# Patient Record
Sex: Female | Born: 1935 | Race: White | Hispanic: No | Marital: Married | Smoking: Former smoker
Health system: Southern US, Community
[De-identification: ages and names within clinical notes are randomized; demographics above are authoritative.]

## PROBLEM LIST (undated history)

## (undated) DIAGNOSIS — I10 Essential (primary) hypertension: Secondary | ICD-10-CM

## (undated) DIAGNOSIS — C801 Malignant (primary) neoplasm, unspecified: Secondary | ICD-10-CM

## (undated) DIAGNOSIS — G459 Transient cerebral ischemic attack, unspecified: Secondary | ICD-10-CM

## (undated) DIAGNOSIS — J449 Chronic obstructive pulmonary disease, unspecified: Secondary | ICD-10-CM

## (undated) DIAGNOSIS — I219 Acute myocardial infarction, unspecified: Secondary | ICD-10-CM

## (undated) HISTORY — DX: Transient cerebral ischemic attack, unspecified: G45.9

## (undated) HISTORY — DX: Malignant (primary) neoplasm, unspecified: C80.1

## (undated) HISTORY — DX: Essential (primary) hypertension: I10

## (undated) HISTORY — PX: OTHER SURGICAL HISTORY: SHX169

## (undated) HISTORY — DX: Acute myocardial infarction, unspecified: I21.9

---

## 1998-09-24 ENCOUNTER — Encounter: Payer: Self-pay | Admitting: Emergency Medicine

## 1998-09-24 ENCOUNTER — Emergency Department (HOSPITAL_COMMUNITY): Admission: EM | Admit: 1998-09-24 | Discharge: 1998-09-24 | Payer: Self-pay | Admitting: Emergency Medicine

## 1998-10-21 ENCOUNTER — Ambulatory Visit (HOSPITAL_COMMUNITY): Admission: RE | Admit: 1998-10-21 | Discharge: 1998-10-21 | Payer: Self-pay | Admitting: Family Medicine

## 1998-10-21 ENCOUNTER — Encounter: Payer: Self-pay | Admitting: Family Medicine

## 2003-08-29 ENCOUNTER — Ambulatory Visit (HOSPITAL_COMMUNITY): Admission: RE | Admit: 2003-08-29 | Discharge: 2003-08-29 | Payer: Self-pay | Admitting: Ophthalmology

## 2004-10-21 ENCOUNTER — Emergency Department (HOSPITAL_COMMUNITY): Admission: EM | Admit: 2004-10-21 | Discharge: 2004-10-21 | Payer: Self-pay | Admitting: Emergency Medicine

## 2004-11-01 ENCOUNTER — Inpatient Hospital Stay (HOSPITAL_COMMUNITY): Admission: EM | Admit: 2004-11-01 | Discharge: 2004-11-16 | Payer: Self-pay | Admitting: Emergency Medicine

## 2004-11-01 ENCOUNTER — Ambulatory Visit: Payer: Self-pay | Admitting: Family Medicine

## 2004-11-02 ENCOUNTER — Encounter (INDEPENDENT_AMBULATORY_CARE_PROVIDER_SITE_OTHER): Payer: Self-pay | Admitting: Cardiology

## 2005-06-07 ENCOUNTER — Ambulatory Visit (HOSPITAL_COMMUNITY): Admission: RE | Admit: 2005-06-07 | Discharge: 2005-06-07 | Payer: Self-pay | Admitting: Gastroenterology

## 2005-06-22 ENCOUNTER — Encounter: Admission: RE | Admit: 2005-06-22 | Discharge: 2005-06-22 | Payer: Self-pay | Admitting: Family Medicine

## 2006-01-31 ENCOUNTER — Inpatient Hospital Stay (HOSPITAL_COMMUNITY): Admission: EM | Admit: 2006-01-31 | Discharge: 2006-02-02 | Payer: Self-pay | Admitting: Emergency Medicine

## 2006-07-28 ENCOUNTER — Encounter: Admission: RE | Admit: 2006-07-28 | Discharge: 2006-07-28 | Payer: Self-pay | Admitting: Family Medicine

## 2006-09-20 ENCOUNTER — Ambulatory Visit: Admission: RE | Admit: 2006-09-20 | Discharge: 2006-09-20 | Payer: Self-pay | Admitting: Gynecologic Oncology

## 2006-09-22 ENCOUNTER — Encounter: Admission: RE | Admit: 2006-09-22 | Discharge: 2006-09-22 | Payer: Self-pay | Admitting: Gynecologic Oncology

## 2006-12-25 ENCOUNTER — Ambulatory Visit (HOSPITAL_COMMUNITY): Admission: RE | Admit: 2006-12-25 | Discharge: 2006-12-25 | Payer: Self-pay | Admitting: Gynecologic Oncology

## 2007-01-09 ENCOUNTER — Ambulatory Visit: Admission: RE | Admit: 2007-01-09 | Discharge: 2007-01-09 | Payer: Self-pay | Admitting: Gynecologic Oncology

## 2007-08-27 ENCOUNTER — Ambulatory Visit: Payer: Self-pay | Admitting: Cardiovascular Disease

## 2007-08-27 LAB — CONVERTED CEMR LAB
Chloride: 94 meq/L — ABNORMAL LOW (ref 96–112)
GFR calc Af Amer: 104 mL/min
GFR calc non Af Amer: 86 mL/min
Glucose, Bld: 107 mg/dL — ABNORMAL HIGH (ref 70–99)
Sodium: 129 meq/L — ABNORMAL LOW (ref 135–145)

## 2007-09-07 ENCOUNTER — Ambulatory Visit: Payer: Self-pay

## 2007-09-07 ENCOUNTER — Encounter: Payer: Self-pay | Admitting: Cardiovascular Disease

## 2007-09-19 DIAGNOSIS — F3289 Other specified depressive episodes: Secondary | ICD-10-CM | POA: Insufficient documentation

## 2007-09-19 DIAGNOSIS — K219 Gastro-esophageal reflux disease without esophagitis: Secondary | ICD-10-CM

## 2007-09-19 DIAGNOSIS — F329 Major depressive disorder, single episode, unspecified: Secondary | ICD-10-CM

## 2007-10-01 ENCOUNTER — Ambulatory Visit: Payer: Self-pay | Admitting: Pulmonary Disease

## 2007-10-01 ENCOUNTER — Ambulatory Visit: Payer: Self-pay | Admitting: Internal Medicine

## 2007-10-01 DIAGNOSIS — R0989 Other specified symptoms and signs involving the circulatory and respiratory systems: Secondary | ICD-10-CM | POA: Insufficient documentation

## 2007-10-01 DIAGNOSIS — I219 Acute myocardial infarction, unspecified: Secondary | ICD-10-CM | POA: Insufficient documentation

## 2007-10-01 DIAGNOSIS — I10 Essential (primary) hypertension: Secondary | ICD-10-CM | POA: Insufficient documentation

## 2007-10-01 DIAGNOSIS — J309 Allergic rhinitis, unspecified: Secondary | ICD-10-CM | POA: Insufficient documentation

## 2007-10-01 DIAGNOSIS — J439 Emphysema, unspecified: Secondary | ICD-10-CM

## 2007-10-01 DIAGNOSIS — I6789 Other cerebrovascular disease: Secondary | ICD-10-CM

## 2007-10-01 DIAGNOSIS — Z85828 Personal history of other malignant neoplasm of skin: Secondary | ICD-10-CM

## 2007-10-01 DIAGNOSIS — R0609 Other forms of dyspnea: Secondary | ICD-10-CM

## 2007-10-05 ENCOUNTER — Ambulatory Visit: Payer: Self-pay | Admitting: Cardiovascular Disease

## 2008-01-02 ENCOUNTER — Encounter: Payer: Self-pay | Admitting: Pulmonary Disease

## 2008-05-22 ENCOUNTER — Ambulatory Visit: Payer: Self-pay | Admitting: Cardiovascular Disease

## 2008-06-20 ENCOUNTER — Ambulatory Visit: Payer: Self-pay | Admitting: Pulmonary Disease

## 2008-09-23 ENCOUNTER — Ambulatory Visit: Payer: Self-pay

## 2008-12-24 ENCOUNTER — Ambulatory Visit: Payer: Self-pay | Admitting: Pulmonary Disease

## 2008-12-24 DIAGNOSIS — J9610 Chronic respiratory failure, unspecified whether with hypoxia or hypercapnia: Secondary | ICD-10-CM

## 2009-02-20 ENCOUNTER — Encounter (INDEPENDENT_AMBULATORY_CARE_PROVIDER_SITE_OTHER): Payer: Self-pay | Admitting: *Deleted

## 2009-11-09 ENCOUNTER — Telehealth: Payer: Self-pay | Admitting: Pulmonary Disease

## 2010-01-21 ENCOUNTER — Ambulatory Visit: Payer: Self-pay | Admitting: Pulmonary Disease

## 2010-09-21 ENCOUNTER — Encounter: Payer: Self-pay | Admitting: Cardiovascular Disease

## 2010-09-21 DIAGNOSIS — I6529 Occlusion and stenosis of unspecified carotid artery: Secondary | ICD-10-CM | POA: Insufficient documentation

## 2010-09-24 ENCOUNTER — Ambulatory Visit: Admission: RE | Admit: 2010-09-24 | Discharge: 2010-09-24 | Payer: Self-pay | Source: Home / Self Care

## 2010-09-24 ENCOUNTER — Encounter: Payer: Self-pay | Admitting: Cardiovascular Disease

## 2010-09-24 ENCOUNTER — Ambulatory Visit
Admission: RE | Admit: 2010-09-24 | Discharge: 2010-09-24 | Payer: Medicare Other | Source: Home / Self Care | Attending: Pulmonary Disease | Admitting: Pulmonary Disease

## 2010-09-24 ENCOUNTER — Inpatient Hospital Stay (HOSPITAL_COMMUNITY)
Admission: EM | Admit: 2010-09-24 | Discharge: 2010-10-06 | Payer: Self-pay | Source: Home / Self Care | Attending: Internal Medicine | Admitting: Internal Medicine

## 2010-09-25 DIAGNOSIS — J449 Chronic obstructive pulmonary disease, unspecified: Secondary | ICD-10-CM

## 2010-09-25 DIAGNOSIS — Z01818 Encounter for other preprocedural examination: Secondary | ICD-10-CM

## 2010-09-29 ENCOUNTER — Encounter (INDEPENDENT_AMBULATORY_CARE_PROVIDER_SITE_OTHER): Payer: Self-pay | Admitting: Internal Medicine

## 2010-09-30 ENCOUNTER — Telehealth: Payer: Self-pay | Admitting: Cardiovascular Disease

## 2010-10-04 LAB — BASIC METABOLIC PANEL
BUN: 10 mg/dL (ref 6–23)
BUN: 8 mg/dL (ref 6–23)
BUN: 7 mg/dL (ref 6–23)
BUN: 7 mg/dL (ref 6–23)
BUN: 13 mg/dL (ref 6–23)
BUN: 19 mg/dL (ref 6–23)
BUN: 20 mg/dL (ref 6–23)
BUN: 20 mg/dL (ref 6–23)
CO2: 29 mEq/L (ref 19–32)
CO2: 29 mEq/L (ref 19–32)
CO2: 24 mEq/L (ref 19–32)
CO2: 24 mEq/L (ref 19–32)
CO2: 34 mEq/L — ABNORMAL HIGH (ref 19–32)
CO2: 33 mEq/L — ABNORMAL HIGH (ref 19–32)
CO2: 33 mEq/L — ABNORMAL HIGH (ref 19–32)
CO2: 31 mEq/L (ref 19–32)
Calcium: 9.3 mg/dL (ref 8.4–10.5)
Calcium: 8.8 mg/dL (ref 8.4–10.5)
Calcium: 8 mg/dL — ABNORMAL LOW (ref 8.4–10.5)
Calcium: 7.8 mg/dL — ABNORMAL LOW (ref 8.4–10.5)
Calcium: 8.8 mg/dL (ref 8.4–10.5)
Calcium: 9 mg/dL (ref 8.4–10.5)
Calcium: 8.9 mg/dL (ref 8.4–10.5)
Calcium: 8.7 mg/dL (ref 8.4–10.5)
Chloride: 104 mEq/L (ref 96–112)
Chloride: 103 mEq/L (ref 96–112)
Chloride: 106 mEq/L (ref 96–112)
Chloride: 106 mEq/L (ref 96–112)
Chloride: 93 mEq/L — ABNORMAL LOW (ref 96–112)
Chloride: 93 mEq/L — ABNORMAL LOW (ref 96–112)
Chloride: 97 mEq/L (ref 96–112)
Chloride: 99 mEq/L (ref 96–112)
Creatinine, Ser: 0.76 mg/dL (ref 0.4–1.2)
Creatinine, Ser: 0.74 mg/dL (ref 0.4–1.2)
Creatinine, Ser: 0.78 mg/dL (ref 0.4–1.2)
Creatinine, Ser: 0.72 mg/dL (ref 0.4–1.2)
Creatinine, Ser: 0.68 mg/dL (ref 0.4–1.2)
Creatinine, Ser: 0.72 mg/dL (ref 0.4–1.2)
Creatinine, Ser: 0.81 mg/dL (ref 0.4–1.2)
Creatinine, Ser: 0.7 mg/dL (ref 0.4–1.2)
GFR calc Af Amer: >60 mL/min (ref >60)
GFR calc Af Amer: >60 mL/min (ref >60)
GFR calc Af Amer: >60 mL/min (ref >60)
GFR calc Af Amer: >60 mL/min (ref >60)
GFR calc Af Amer: >60 mL/min (ref >60)
GFR calc Af Amer: >60 mL/min (ref >60)
GFR calc Af Amer: >60 mL/min (ref >60)
GFR calc Af Amer: >60 mL/min (ref >60)
GFR calc non Af Amer: >60 mL/min (ref >60)
GFR calc non Af Amer: >60 mL/min (ref >60)
GFR calc non Af Amer: >60 mL/min (ref >60)
GFR calc non Af Amer: >60 mL/min (ref >60)
GFR calc non Af Amer: >60 mL/min (ref >60)
GFR calc non Af Amer: >60 mL/min (ref >60)
GFR calc non Af Amer: >60 mL/min (ref >60)
GFR calc non Af Amer: >60 mL/min (ref >60)
Glucose, Bld: 87 mg/dL (ref 70–99)
Glucose, Bld: 102 mg/dL — ABNORMAL HIGH (ref 70–99)
Glucose, Bld: 94 mg/dL (ref 70–99)
Glucose, Bld: 116 mg/dL — ABNORMAL HIGH (ref 70–99)
Glucose, Bld: 138 mg/dL — ABNORMAL HIGH (ref 70–99)
Glucose, Bld: 115 mg/dL — ABNORMAL HIGH (ref 70–99)
Glucose, Bld: 92 mg/dL (ref 70–99)
Glucose, Bld: 97 mg/dL (ref 70–99)
Potassium: 3.9 mEq/L (ref 3.5–5.1)
Potassium: 4.1 mEq/L (ref 3.5–5.1)
Potassium: 4.1 mEq/L (ref 3.5–5.1)
Potassium: 4.3 mEq/L (ref 3.5–5.1)
Potassium: 3.3 mEq/L — ABNORMAL LOW (ref 3.5–5.1)
Potassium: 3.2 mEq/L — ABNORMAL LOW (ref 3.5–5.1)
Potassium: 3.4 mEq/L — ABNORMAL LOW (ref 3.5–5.1)
Potassium: 4 mEq/L (ref 3.5–5.1)
Sodium: 141 mEq/L (ref 135–145)
Sodium: 138 mEq/L (ref 135–145)
Sodium: 137 mEq/L (ref 135–145)
Sodium: 137 mEq/L (ref 135–145)
Sodium: 137 mEq/L (ref 135–145)
Sodium: 136 mEq/L (ref 135–145)
Sodium: 138 mEq/L (ref 135–145)
Sodium: 136 mEq/L (ref 135–145)

## 2010-10-04 LAB — CROSSMATCH
ABO/RH(D): A POS
Antibody Screen: NEGATIVE
Unit division: 0
Unit division: 0

## 2010-10-04 LAB — COMPREHENSIVE METABOLIC PANEL
ALT: 60 U/L — ABNORMAL HIGH (ref 0–35)
ALT: 42 U/L — ABNORMAL HIGH (ref 0–35)
AST: 90 U/L — ABNORMAL HIGH (ref 0–37)
AST: 44 U/L — ABNORMAL HIGH (ref 0–37)
Albumin: 2.2 g/dL — ABNORMAL LOW (ref 3.5–5.2)
Albumin: 2.2 g/dL — ABNORMAL LOW (ref 3.5–5.2)
Alkaline Phosphatase: 98 U/L (ref 39–117)
Alkaline Phosphatase: 93 U/L (ref 39–117)
BUN: 7 mg/dL (ref 6–23)
BUN: 8 mg/dL (ref 6–23)
CO2: 24 mEq/L (ref 19–32)
CO2: 28 mEq/L (ref 19–32)
Calcium: 8.1 mg/dL — ABNORMAL LOW (ref 8.4–10.5)
Calcium: 8.2 mg/dL — ABNORMAL LOW (ref 8.4–10.5)
Chloride: 107 mEq/L (ref 96–112)
Chloride: 100 mEq/L (ref 96–112)
Creatinine, Ser: 0.62 mg/dL (ref 0.4–1.2)
Creatinine, Ser: 0.59 mg/dL (ref 0.4–1.2)
GFR calc Af Amer: >60 mL/min (ref >60)
GFR calc Af Amer: >60 mL/min (ref >60)
GFR calc non Af Amer: >60 mL/min (ref >60)
GFR calc non Af Amer: >60 mL/min (ref >60)
Glucose, Bld: 123 mg/dL — ABNORMAL HIGH (ref 70–99)
Glucose, Bld: 108 mg/dL — ABNORMAL HIGH (ref 70–99)
Potassium: 4.5 mEq/L (ref 3.5–5.1)
Potassium: 4 mEq/L (ref 3.5–5.1)
Sodium: 137 mEq/L (ref 135–145)
Sodium: 134 mEq/L — ABNORMAL LOW (ref 135–145)
Total Bilirubin: 0.9 mg/dL (ref 0.3–1.2)
Total Bilirubin: 1.1 mg/dL (ref 0.3–1.2)
Total Protein: 5 g/dL — ABNORMAL LOW (ref 6.0–8.3)
Total Protein: 5.4 g/dL — ABNORMAL LOW (ref 6.0–8.3)

## 2010-10-04 LAB — PROTIME-INR
INR: 1.13 (ref 0.00–1.49)
INR: 1.17 (ref 0.00–1.49)
INR: 1.02 (ref 0.00–1.49)
INR: 1.07 (ref 0.00–1.49)
INR: 1.07 (ref 0.00–1.49)
Prothrombin Time: 14.7 seconds (ref 11.6–15.2)
Prothrombin Time: 15.1 seconds (ref 11.6–15.2)
Prothrombin Time: 13.6 seconds (ref 11.6–15.2)
Prothrombin Time: 14.1 seconds (ref 11.6–15.2)
Prothrombin Time: 14.1 seconds (ref 11.6–15.2)

## 2010-10-04 LAB — CARDIAC PANEL(CRET KIN+CKTOT+MB+TROPI)
CK, MB: 0.7 ng/mL (ref 0.3–4.0)
CK, MB: 2.8 ng/mL (ref 0.3–4.0)
CK, MB: 3.2 ng/mL (ref 0.3–4.0)
CK, MB: 3.5 ng/mL (ref 0.3–4.0)
Relative Index: INVALID (ref 0.0–2.5)
Relative Index: 2.5 (ref 0.0–2.5)
Relative Index: 2.7 — ABNORMAL HIGH (ref 0.0–2.5)
Relative Index: 1.9 (ref 0.0–2.5)
Total CK: 30 U/L (ref 7–177)
Total CK: 113 U/L (ref 7–177)
Total CK: 118 U/L (ref 7–177)
Total CK: 180 U/L — ABNORMAL HIGH (ref 7–177)
Troponin I: 0.05 ng/mL (ref 0.00–0.06)
Troponin I: 0.02 ng/mL (ref 0.00–0.06)
Troponin I: 0.01 ng/mL (ref 0.00–0.06)
Troponin I: 0.11 ng/mL — ABNORMAL HIGH (ref 0.00–0.06)

## 2010-10-04 LAB — CBC
HCT: 41.4 % (ref 36.0–46.0)
HCT: 40.5 % (ref 36.0–46.0)
HCT: 33.1 % — ABNORMAL LOW (ref 36.0–46.0)
HCT: 28 % — ABNORMAL LOW (ref 36.0–46.0)
HCT: 29.9 % — ABNORMAL LOW (ref 36.0–46.0)
HCT: 24.3 % — ABNORMAL LOW (ref 36.0–46.0)
HCT: 31.3 % — ABNORMAL LOW (ref 36.0–46.0)
HCT: 32.1 % — ABNORMAL LOW (ref 36.0–46.0)
HCT: 30.9 % — ABNORMAL LOW (ref 36.0–46.0)
Hemoglobin: 14.2 g/dL (ref 12.0–15.0)
Hemoglobin: 13.4 g/dL (ref 12.0–15.0)
Hemoglobin: 11 g/dL — ABNORMAL LOW (ref 12.0–15.0)
Hemoglobin: 9.3 g/dL — ABNORMAL LOW (ref 12.0–15.0)
Hemoglobin: 9.1 g/dL — ABNORMAL LOW (ref 12.0–15.0)
Hemoglobin: 8.2 g/dL — ABNORMAL LOW (ref 12.0–15.0)
Hemoglobin: 10.4 g/dL — ABNORMAL LOW (ref 12.0–15.0)
Hemoglobin: 10.8 g/dL — ABNORMAL LOW (ref 12.0–15.0)
Hemoglobin: 10.4 g/dL — ABNORMAL LOW (ref 12.0–15.0)
MCH: 31.7 pg (ref 26.0–34.0)
MCH: 30.8 pg (ref 26.0–34.0)
MCH: 30.6 pg (ref 26.0–34.0)
MCH: 31.2 pg (ref 26.0–34.0)
MCH: 29.2 pg (ref 26.0–34.0)
MCH: 31.4 pg (ref 26.0–34.0)
MCH: 30.2 pg (ref 26.0–34.0)
MCH: 30.8 pg (ref 26.0–34.0)
MCH: 30.8 pg (ref 26.0–34.0)
MCHC: 34.3 g/dL (ref 30.0–36.0)
MCHC: 33.1 g/dL (ref 30.0–36.0)
MCHC: 32.9 g/dL (ref 30.0–36.0)
MCHC: 33.2 g/dL (ref 30.0–36.0)
MCHC: 30.4 g/dL (ref 30.0–36.0)
MCHC: 33.7 g/dL (ref 30.0–36.0)
MCHC: 33.2 g/dL (ref 30.0–36.0)
MCHC: 33.6 g/dL (ref 30.0–36.0)
MCHC: 33.7 g/dL (ref 30.0–36.0)
MCV: 92.4 fL (ref 78.0–100.0)
MCV: 93.1 fL (ref 78.0–100.0)
MCV: 93 fL (ref 78.0–100.0)
MCV: 94 fL (ref 78.0–100.0)
MCV: 95.8 fL (ref 78.0–100.0)
MCV: 93.1 fL (ref 78.0–100.0)
MCV: 91 fL (ref 78.0–100.0)
MCV: 91.5 fL (ref 78.0–100.0)
MCV: 91.4 fL (ref 78.0–100.0)
Platelets: 237 10*3/uL (ref 150–400)
Platelets: 221 10*3/uL (ref 150–400)
Platelets: 178 10*3/uL (ref 150–400)
Platelets: 196 10*3/uL (ref 150–400)
Platelets: 185 10*3/uL (ref 150–400)
Platelets: 231 10*3/uL (ref 150–400)
Platelets: 248 10*3/uL (ref 150–400)
Platelets: 239 10*3/uL (ref 150–400)
Platelets: 284 10*3/uL (ref 150–400)
RBC: 4.48 MIL/uL (ref 3.87–5.11)
RBC: 4.35 MIL/uL (ref 3.87–5.11)
RBC: 3.56 MIL/uL — ABNORMAL LOW (ref 3.87–5.11)
RBC: 2.98 MIL/uL — ABNORMAL LOW (ref 3.87–5.11)
RBC: 3.12 MIL/uL — ABNORMAL LOW (ref 3.87–5.11)
RBC: 2.61 MIL/uL — ABNORMAL LOW (ref 3.87–5.11)
RBC: 3.44 MIL/uL — ABNORMAL LOW (ref 3.87–5.11)
RBC: 3.51 MIL/uL — ABNORMAL LOW (ref 3.87–5.11)
RBC: 3.38 MIL/uL — ABNORMAL LOW (ref 3.87–5.11)
RDW: 14.6 % (ref 11.5–15.5)
RDW: 14.8 % (ref 11.5–15.5)
RDW: 14.8 % (ref 11.5–15.5)
RDW: 15.4 % (ref 11.5–15.5)
RDW: 15.7 % — ABNORMAL HIGH (ref 11.5–15.5)
RDW: 15.5 % (ref 11.5–15.5)
RDW: 15.5 % (ref 11.5–15.5)
RDW: 15.5 % (ref 11.5–15.5)
RDW: 15.1 % (ref 11.5–15.5)
WBC: 9.8 10*3/uL (ref 4.0–10.5)
WBC: 9.1 10*3/uL (ref 4.0–10.5)
WBC: 10.2 10*3/uL (ref 4.0–10.5)
WBC: 10.1 10*3/uL (ref 4.0–10.5)
WBC: 8.6 10*3/uL (ref 4.0–10.5)
WBC: 10.3 10*3/uL (ref 4.0–10.5)
WBC: 10.3 10*3/uL (ref 4.0–10.5)
WBC: 9.5 10*3/uL (ref 4.0–10.5)
WBC: 11.6 10*3/uL — ABNORMAL HIGH (ref 4.0–10.5)

## 2010-10-04 LAB — BLOOD GAS, ARTERIAL
Acid-Base Excess: 4.4 mmol/L — ABNORMAL HIGH (ref 0.0–2.0)
Bicarbonate: 28.3 mEq/L — ABNORMAL HIGH (ref 20.0–24.0)
Drawn by: 317871
FIO2: 0.5 %
O2 Saturation: 87 %
Patient temperature: 98.6
TCO2: 26.7 mmol/L (ref 0–100)
pCO2 arterial: 41 mmHg (ref 35.0–45.0)
pH, Arterial: 7.453 — ABNORMAL HIGH (ref 7.350–7.400)
pO2, Arterial: 48.5 mmHg — ABNORMAL LOW (ref 80.0–100.0)

## 2010-10-04 LAB — VANCOMYCIN, TROUGH: Vancomycin Tr: 10.3 ug/mL (ref 10.0–20.0)

## 2010-10-04 LAB — DIFFERENTIAL
Basophils Absolute: 0 10*3/uL (ref 0.0–0.1)
Basophils Relative: 0 % (ref 0–1)
Eosinophils Absolute: 0.1 10*3/uL (ref 0.0–0.7)
Eosinophils Relative: 1 % (ref 0–5)
Lymphocytes Relative: 21 % (ref 12–46)
Lymphs Abs: 2 10*3/uL (ref 0.7–4.0)
Monocytes Absolute: 0.6 10*3/uL (ref 0.1–1.0)
Monocytes Relative: 7 % (ref 3–12)
Neutro Abs: 7 10*3/uL (ref 1.7–7.7)
Neutrophils Relative %: 72 % (ref 43–77)

## 2010-10-04 LAB — MRSA PCR SCREENING: MRSA by PCR: NEGATIVE

## 2010-10-04 LAB — URINALYSIS, ROUTINE W REFLEX MICROSCOPIC
Bilirubin Urine: NEGATIVE
Hgb urine dipstick: NEGATIVE
Ketones, ur: NEGATIVE mg/dL
Nitrite: NEGATIVE
Protein, ur: NEGATIVE mg/dL
Specific Gravity, Urine: 1.01 (ref 1.005–1.030)
Urine Glucose, Fasting: NEGATIVE mg/dL
Urobilinogen, UA: 0.2 mg/dL (ref 0.0–1.0)
pH: 7 (ref 5.0–8.0)

## 2010-10-04 LAB — HEPARIN LEVEL (UNFRACTIONATED)
Heparin Unfractionated: 0.21 IU/mL — ABNORMAL LOW (ref 0.30–0.70)
Heparin Unfractionated: 0.33 IU/mL (ref 0.30–0.70)
Heparin Unfractionated: 0.36 IU/mL (ref 0.30–0.70)

## 2010-10-04 LAB — ABO/RH: ABO/RH(D): A POS

## 2010-10-04 LAB — GLUCOSE, CAPILLARY: Glucose-Capillary: 110 mg/dL — ABNORMAL HIGH (ref 70–99)

## 2010-10-04 LAB — D-DIMER, QUANTITATIVE: D-Dimer, Quant: 4.65 ug/mL-FEU — ABNORMAL HIGH (ref 0.00–0.48)

## 2010-10-04 LAB — MAGNESIUM: Magnesium: 2.3 mg/dL (ref 1.5–2.5)

## 2010-10-04 LAB — BRAIN NATRIURETIC PEPTIDE
Pro B Natriuretic peptide (BNP): 141 pg/mL — ABNORMAL HIGH (ref 0.0–100.0)
Pro B Natriuretic peptide (BNP): 92.3 pg/mL (ref 0.0–100.0)

## 2010-10-06 LAB — CBC
HCT: 32.9 % — ABNORMAL LOW (ref 36.0–46.0)
Hemoglobin: 10.7 g/dL — ABNORMAL LOW (ref 12.0–15.0)
MCH: 30.6 pg (ref 26.0–34.0)
MCHC: 32.5 g/dL (ref 30.0–36.0)
MCV: 94 fL (ref 78.0–100.0)
Platelets: 228 10*3/uL (ref 150–400)
RBC: 3.5 MIL/uL — ABNORMAL LOW (ref 3.87–5.11)
RDW: 15.4 % (ref 11.5–15.5)
WBC: 9.8 10*3/uL (ref 4.0–10.5)

## 2010-10-06 LAB — PROTIME-INR
INR: 1.1 (ref 0.00–1.49)
INR: 1.18 (ref 0.00–1.49)
Prothrombin Time: 14.4 seconds (ref 11.6–15.2)
Prothrombin Time: 15.2 seconds (ref 11.6–15.2)

## 2010-10-08 ENCOUNTER — Encounter (INDEPENDENT_AMBULATORY_CARE_PROVIDER_SITE_OTHER): Payer: Self-pay | Admitting: *Deleted

## 2010-10-11 LAB — PROTIME-INR: Prothrombin Time: 16.8 seconds — ABNORMAL HIGH (ref 11.6–15.2)

## 2010-10-11 NOTE — Discharge Summary (Addendum)
[name], [name]               ACCOUNT NO.:  [id number]  MEDICAL RECORD NO.:  [id number]          PATIENT TYPE:  INP  LOCATION:  1504                         FACILITY:  [facility]  PHYSICIAN:  Erick Blinks, MD     DATE OF BIRTH:  1927-05-05  DATE OF ADMISSION:  09/24/2010 DATE OF DISCHARGE:                        DISCHARGE SUMMARY - REFERRING   PRIMARY CARE PHYSICIAN:  Lianne Bushy, M.D. [place].  DISCHARGE DIAGNOSES: 1. Right femur subtrochanteric fracture status post open reduction and     internal fixation. 2. Acute-on-chronic respiratory failure. 3. Chronic obstructive pulmonary disease. 4. Acute-on-chronic diastolic congestive heart failure. 5. History of coronary artery disease. 6. Chronic anemia status post 2 units of PRBC. 7. Hypokalemia. 8. Gastroesophageal reflux disease. 9. Obesity.  DISCHARGE MEDICATIONS: 1. MiraLAX 17 g p.o. daily p.r.n. 2. Spiriva 18 mcg inhaled daily. 3. Albuterol 2.5 mg inhaled q.3h p.r.n. via nebulizer. 4. Metoprolol 12.5 mg p.o. b.i.d. 5. Coumadin 6 mg p.o. daily for DVT prophylaxis post surgery. 6. Lasix 20 mg p.o. daily. 7. Potassium chloride 20 mEq 1 tablet p.o. every other day. 8. Nexium 40 mg 1 capsule p.o. daily p.r.n. 9. Enalapril 5 mg 1 tablet p.o. daily. 10.Isosorbide mononitrate XR 30 mg 1/2 tablet p.o. daily. 11.Vytorin 10/40 mg 1 tablet p.o. daily. 12.Enteric-coated aspirin 81 mg p.o. daily. 13.Effexor 75 mg 1 tablet p.o. daily. 14.Gabapentin 300 mg p.o. daily.  BRIEF ADMISSION HISTORY:  This is an 77 year old female with history of COPD, chronic respiratory failure and coronary artery disease who is on oxygen, 3 liters, at home.  She presents to the emergency room after having a fall.  She denied any chest pain.  She reported that her shortness of breath was at baseline.  In the ER, she was found to have a right femoral fracture.  She was subsequently admitted for further evaluation.  For further  details, please refer to the history and physical dictated by Dr. Sunnie Nielsen on January 7.  HOSPITAL COURSE: 1. Right femur fracture.  The patient was seen by Dr. August Saucer from     Orthopedics.  She underwent an open reduction and internal fixation     on January 7.  She is currently working with physical therapy and     will be transferred to a nursing home for continued physical     therapy.  She will need to see Dr. August Saucer in the office in the next 1-     2 weeks. 2. Acute-on-chronic respiratory failure.  The patient had worsening of     her respiratory failure requiring a non-rebreather.  She was     followed by the Pulmonary Service, and it was felt that her     worsening shortness of breath was likely secondary to volume     overload.  She was aggressively diuresed.  She was also started     empirically on antibiotics, but it does not appear that the patient     actually had a pneumonia.  She is currently not on antibiotics and     is afebrile.  Her respiratory failure has improved.  She is  currently back to 3-4 L, which is her baseline.  Her work of     breathing is improved, and she is able to speak without any     difficulty at this time.  Further titration may be completed as an     outpatient. 3. Acute-on-chronic diastolic congestive heart failure.  The patient     was seen in consultation by Cardiology.  She was aggressively     diuresed.  She had minimal elevation of her troponin, which was     felt to be demand ischemia.  She was ruled out for a pulmonary     embolism.  Her medications were adjusted, and from a heart failure     standpoint, she is stable. 4. Anemia.  This was likely multifactorial.  She received 2 units of     PRBCs during her hospital stay here.  CONSULTATIONS: 1. Orthopedics, Dorene Grebe, M.D. 2. Cardiology,  [facility]. 3. Pulmonary, Barbaraann Share, MD, FCCP.  PROCEDURES:  Right femur subtrochanteric fracture status post open reduction and  internal fixation by Dr. August Saucer on January 7.  DIAGNOSTIC IMAGING: 1. X-ray of the right femur on January 6 shows no acute fracture or     dislocation. 2. X-ray of the right hip on January 6 shows nondisplaced spiral     fracture of the proximal right femur and appears to extend from the     intertrochanteric region distally. 3. X-ray of shoulder on January 6 shows no acute fracture or     dislocation identified about the left shoulder, pulmonary vascular     congestion versus interstitial lung changes and chronic left     lateral rib fractures. 4. Chest x-ray on January 6 shows low lung volumes with no acute     cardiopulmonary abnormality, multilevel thoracic compression     fracture with progression since 2009. 5. Hip x-ray from January 7 shows open reduction and internal fixation     of right proximal femoral fracture with anatomic alignment. 6. Chest x-ray from January 9 shows pulmonary interstitial thickening.     This could represent chronic bronchitis and mild pulmonary venous     congestion, low lung volumes and bibasilar atelectasis. 7. Chest x-ray from January 12 shows persistent diffuse interstitial     and air space process. 8. CT angiogram of the chest on January 12 shows negative for     pulmonary embolus and small bilateral pleural effusions, extensive     centrilobular emphysema and enlarging cyst on the right hepatic     lobe, thoracic spine compression fractures, most consistent with     senile osteoporotic injuries. 9. Chest x-ray on January 13 shows pulmonary edema, poor delineation     of lung bases, and calcified tortuous aorta. 10.Chest x-ray from January 16 shows possible reflux versus     respiratory disease in the right mid-lung.  See report.  DISCHARGE INSTRUCTIONS:  The patient should continue on a heart-healthy, low-calorie diet and conduct her activity as tolerated.  She will need to follow up with Dr. August Saucer in the office in the next 1-2 weeks.  She will  also need to follow up with her primary care physician when she is discharged from the skilled nursing facility.  She is otherwise stable for discharge to the skilled nursing facility at this point.  Time spent on this discharge is 50 minutes.     Erick Blinks, MD     JM/MEDQ  D:  10/06/2010  T:  10/06/2010  Job:  [id number]  cc:   Lianne Bushy, M.D. Fax: [phone]  Electronically Signed by Erick Blinks  on 10/11/2010 12:38:09 PM

## 2010-10-15 ENCOUNTER — Ambulatory Visit: Admit: 2010-10-15 | Payer: Self-pay | Admitting: Cardiovascular Disease

## 2010-10-19 NOTE — Assessment & Plan Note (Signed)
Summary: [name] for severe emphysema   Copy to:  Nishan Primary Provider/Referring Provider:  Kathrynn Humble [facility])  CC:  Pt is here for an overdue f/u appt.  Pt c/o increased sob with exertion.  Pt denied a cough.  Pt denied any new complaints.  Pt requesting refills on Spiriva and Symbicort and Proair.  [name]  History of Present Illness: The pt comes in today for f/u of her emphysema with chronic respiratory failure.  She has chronic doe, and feels that she may be a little worse from her usual baseline.  She admits that she is deconditioned, and does very little exertional activities.  She denies any cough or chest congestion.    Current Medications (verified): 1)  Plavix 75 Mg  Tabs (Clopidogrel Bisulfate) .... Take 1 Tablet By Mouth Once A Day 2)  Furosemide 20 Mg  Tabs (Furosemide) .... Take One To Two Tabs By Mouth Once Daily 3)  Klor-Con M20 20 Meq  Tbcr (Potassium Chloride Crys Cr) .... Take 1 Tablet By Mouth Once A Day As Needed 4)  Tums 500 Mg Chew (Calcium Carbonate Antacid) .... As Needed 5)  Enalapril-Hydrochlorothiazide 5-12.5 Mg  Tabs (Enalapril-Hydrochlorothiazide) .... Take 1 Tablet By Mouth Once A Day 6)  Isosorbide Mononitrate Cr 30 Mg  Tb24 (Isosorbide Mononitrate) .... Take 1 Tablet By Mouth Once A Day 7)  Metoprolol Tartrate 50 Mg  Tabs (Metoprolol Tartrate) .... Take 1 Tablet By Mouth Once A Day 8)  Gabapentin 300 Mg  Caps (Gabapentin) .... Take One Tab By Mouth Once Daily 9)  Effexor Xr 75 Mg  Cp24 (Venlafaxine Hcl) .... Take 1 Tablet By Mouth Once A Day 10)  Vytorin 10-40 Mg  Tabs (Ezetimibe-Simvastatin) .... Take 1 Tablet By Mouth Once A Day 11)  Spiriva Handihaler 18 Mcg  Caps (Tiotropium Bromide Monohydrate) .... Inhale Contents of 1 Capsule Once A Day 12)  Symbicort 160-4.5 Mcg/act  Aero (Budesonide-Formoterol Fumarate) .... Inhale 2 Puffs Two Times A Day. Rinse Out Mouth 13)  Proair Hfa 108 (90 Base) Mcg/act  Aers (Albuterol Sulfate) .[name].. 1-2 Puffs Every 4-6 Hours As  Needed 14)  Maalox .... Take By Mouth As Needed 15)  Bayer Aspirin 325 Mg Tabs (Aspirin) .... Take 1 Tablet By Mouth Once A Day 16)  Caltrate 600mg  .... Take One To Two Tabs By Mouth Once Daily 17)  Centrum Silver   Tabs (Multiple Vitamins-Minerals) .... Take 1 Tablet By Mouth Once A Day 18)  Omega 3 .... Take By Mouth Once Daily 19)  Vitamin B-12 500 Mcg  Tabs (Cyanocobalamin) .... Take 1 Tablet By Mouth Once A Day 20)  Oxygen .... 2.5 Pulsed 21)  Vitamin B1 .... Take 1 Tablet By Mouth Once A Day  Allergies (verified): 1)  ! Actonel 2)  ! Codeine 3)  ! Sulfa  Review of Systems      See HPI  Vital Signs:  Patient profile:   77 year old female Height:      60 inches Weight:      182 pounds BMI:     35.67 O2 Sat:      93 % on 2 LPM cont.   Temp:     97.4 degrees F oral Pulse rate:   82 / minute BP sitting:   138 / 78  (left arm) Cuff size:   regular  Vitals Entered By: Arman Filter LPN (Jan 21, 1609 1:41 PM)  O2 Flow:  2 LPM cont.   CC: Pt is here  for an overdue f/u appt.  Pt c/o increased sob with exertion.  Pt denied a cough.  Pt denied any new complaints.  Pt requesting refills on Spiriva, Symbicort and Proair.   Comments Medications reviewed with patient Arman Filter LPN  Jan 21, 9810 1:41 PM    Physical Exam  General:  ow female in nad Lungs:  decreased bs throughout, no wheezing noted Heart:  rrr, no mrg Extremities:  2+ edema, no cyanosis Neurologic:  alert and oriented, moves all 4.   Impression & Recommendations:  Problem # 1:  EMPHYSEMA (ICD-492.8) the pt is near a stable baseline wrt her pulmonary symptoms.  I have asked her to stay on her current meds, and again consider pulmonary rehab at [facility]. I think a conditioning program at this point will help her more than anything else.   I have also asked her to stay on oxygen 24hrs a day.  Medications Added to Medication List This Visit: 1)  Klor-con M20 20 Meq Tbcr (Potassium chloride crys cr) ....  Take 1 tablet by mouth once a day as needed 2)  Tums 500 Mg Chew (Calcium carbonate antacid) .... As needed 3)  Bayer Aspirin 325 Mg Tabs (Aspirin) .... Take 1 tablet by mouth once a day 4)  Vitamin B1  .... Take 1 tablet by mouth once a day  Other Orders: Prescription Created Electronically [phone]) Est. Patient Level II (29562) Rehabilitation Referral (Rehab)  Patient Instructions: 1)  no change in current meds 2)  will refer to pulmonary rehab at [facility].  Go visit and see if it will work out. 3)  followup with me in 6mos.   Prescriptions: PROAIR HFA 108 (90 BASE) MCG/ACT  AERS (ALBUTEROL SULFATE) 1-2 puffs every 4-6 hours as needed  #1 x 6   Entered and Authorized by:   Barbaraann Share MD   Signed by:   Barbaraann Share MD on 01/21/2010   Method used:   [facility]        [facility] [phone][facility] (retail)       [address]       [place], [place]  [address]       Ph: [phone] or [phone]       Fax: [phone]   RxID:   [id number] SYMBICORT 160-4.5 MCG/ACT  AERO (BUDESONIDE-FORMOTEROL FUMARATE) Inhale 2 puffs two times a day. rinse out mouth  #1 x 6   Entered and Authorized by:   Barbaraann Share MD   Signed by:   Barbaraann Share MD on 01/21/2010   Method used:   [facility] to        [facility] [phone]* (retail)       [address]       [place], [place]  [address]       Ph: [phone] or [phone]       Fax: [phone]   RxID:   [id number] SPIRIVA HANDIHALER 18 MCG  CAPS (TIOTROPIUM BROMIDE MONOHYDRATE) Inhale contents of 1 capsule once a day  #30 Capsule x 6   Entered and Authorized by:   Barbaraann Share MD   Signed by:   Barbaraann Share MD on 01/21/2010   Method used:   [facility] to        [facility] [phone]* (retail)       [address]       [place], [place]  [address]       Ph: [phone] or [phone]       Fax: [phone]   RxID:    [id number]

## 2010-10-19 NOTE — Progress Notes (Signed)
Summary: rx  Phone Note Call from Patient Call back at Home Phone [phone]   Caller: Patient Call For: clance Reason for Call: Talk to Nurse Summary of Call: pt needs refill on Symbicort & Proair for at least 1 month.  [facility]/24/2009[facility] Initial call taken by: Eugene Gavia,  November 09, 2009 1:11 PM  Follow-up for Phone Call        1 rx [facility] symbicort and [facility]air [facility] [place].  LMOMTCB to inform pt of this and she must keep follow up appt for further rxs.  Gweneth Dimitri RN  November 09, 2009 3:01 PM  pt informed. Carron Curie CMA  November 10, 2009 10:24 AM     Prescriptions: PROAIR HFA 108 (90 BASE) MCG/ACT  AERS (ALBUTEROL SULFATE) 1-2 puffs every 4-6 hours as needed  #1 x 0   Entered by:   Gweneth Dimitri RN   Authorized by:   Barbaraann Share MD   Signed by:   Gweneth Dimitri RN on 11/09/2009   Method used:   Electronically to        [facility] [phone]* (retail)       [address]       [place], [place]  [address]       Ph: [phone] or [phone]       Fax: [phone]   RxID:   [id number] SYMBICORT 160-4.5 MCG/ACT  AERO (BUDESONIDE-FORMOTEROL FUMARATE) Inhale 2 puffs two times a day. rinse out mouth  #1 x 0   Entered by:   Gweneth Dimitri RN   Authorized by:   Barbaraann Share MD   Signed by:   Gweneth Dimitri RN on 11/09/2009   Method used:   Electronically to        [facility] [phone]* (retail)       [address]       [place], [place]  [address]       Ph: [phone] or [phone]       Fax: [phone]   RxID:   [id number]

## 2010-10-21 NOTE — Assessment & Plan Note (Signed)
Summary: rov for severe emphysema   Visit Type:  Follow-up Copy to:  Nishan Primary Provider/Referring Provider:  Kathrynn Humble [facility])  CC:  6 month follow up. pt states she has her "good" and "bad" days. pt denies any cough, wheezing, and chest tightness. Marland Kitchen  History of Present Illness: The pt comes in today for f/u of her known severe emphysema with chronic respiratory failure.  She feels her breathing is near her usual baseline, and denies any recent acute exacerbation or pulmonary infection.  She has not had cough or congestion.  She is staying compliant with her bronchodilators and oxygen.  Current Medications (verified): 1)  Plavix 75 Mg  Tabs (Clopidogrel Bisulfate) .Marland Kitchen.. 1 Tablet Every Other Day 2)  Furosemide 20 Mg  Tabs (Furosemide) .... Take One To Two Tabs By Mouth Once Daily 3)  Klor-Con M20 20 Meq  Tbcr (Potassium Chloride Crys Cr) .... Take 1 Tablet By Mouth Once A Day As Needed 4)  Tums 500 Mg Chew (Calcium Carbonate Antacid) .... As Needed 5)  Isosorbide Mononitrate Cr 30 Mg  Tb24 (Isosorbide Mononitrate) .... Take 1 Tablet By Mouth Once A Day 6)  Metoprolol Tartrate 50 Mg  Tabs (Metoprolol Tartrate) .... Take 1 Tablet By Mouth Once A Day 7)  Gabapentin 300 Mg  Caps (Gabapentin) .... Take One Tab By Mouth Once Daily 8)  Effexor Xr 75 Mg  Cp24 (Venlafaxine Hcl) .... Take 1 Tablet By Mouth Once A Day 9)  Vytorin 10-40 Mg  Tabs (Ezetimibe-Simvastatin) .... Take 1 Tablet By Mouth Once A Day 10)  Spiriva Handihaler 18 Mcg  Caps (Tiotropium Bromide Monohydrate) .... Inhale Contents of 1 Capsule Once A Day 11)  Symbicort 160-4.5 Mcg/act  Aero (Budesonide-Formoterol Fumarate) .... Inhale 2 Puffs Two Times A Day. Rinse Out Mouth 12)  Proair Hfa 108 (90 Base) Mcg/act  Aers (Albuterol Sulfate) .Marland Kitchen.. 1-2 Puffs Every 4-6 Hours As Needed 13)  Maalox .... Take By Mouth As Needed 14)  Aspirin 81 Mg Tabs (Aspirin) .Marland Kitchen.. 1-2 Tabs Once Daily 15)  Caltrate 600mg  .... Take One To Two Tabs By Mouth  Once Daily 16)  Centrum Silver   Tabs (Multiple Vitamins-Minerals) .... Take 1 Tablet By Mouth Once A Day 17)  Omega 3 .... Take By Mouth Once Daily 18)  Vitamin B-12 500 Mcg  Tabs (Cyanocobalamin) .... Take 1 Tablet By Mouth Once A Day 19)  Oxygen .... 2.5 Pulsed 20)  Vitamin B1 .... Take 1 Tablet By Mouth Once A Day  Allergies (verified): 1)  ! Actonel 2)  ! Codeine 3)  ! Sulfa  Social History: Patient states former smoker. quit in 10/2004 pt is married. pt is retired. pt has children.  Review of Systems       The patient complains of shortness of breath with activity, acid heartburn, and indigestion.  The patient denies shortness of breath at rest, productive cough, non-productive cough, coughing up blood, chest pain, irregular heartbeats, loss of appetite, weight change, abdominal pain, difficulty swallowing, sore throat, tooth/dental problems, headaches, nasal congestion/difficulty breathing through nose, sneezing, itching, ear ache, anxiety, depression, hand/feet swelling, joint stiffness or pain, rash, change in color of mucus, and fever.    Vital Signs:  Patient profile:   77 year old female Height:      60 inches Weight:      186 pounds BMI:     36.46 O2 Sat:      96 % on 2 L/min Temp:     98 degrees  F oral Pulse rate:   96 / minute BP sitting:   118 / 66  (left arm) Cuff size:   regular  Vitals Entered By: Carver Fila (September 24, 2010 1:30 PM)  O2 Flow:  2 L/min CC: 6 month follow up. pt states she has her "good" and "bad" days. pt denies any cough, wheezing, chest tightness.  Comments meds and allergies updated Phone number updated Mindy Silva  September 24, 2010 1:30 PM    Physical Exam  General:  ow female in nad Lungs:  decreased bs, but no wheezing or rhonchi Heart:  rrr Extremities:  1+ edema bilat, no cyanosis  Neurologic:  alert and oriented, moves all 4.   Impression & Recommendations:  Problem # 1:  EMPHYSEMA (ICD-492.8) the pt is maintaining a  stable baseline wrt her dyspnea.  She has not had an acute exacerbation or recent chest infection.  I have asked her to maintain on her current regimen, and to think about going to pulmonary rehab.  I have reiterated to her the importance of weight reduction and reconditioning.  Problem # 2:  CHRONIC RESPIRATORY FAILURE (ICD-518.83) the pt has adequate sats on current liter flow.  Will have dme show her more portable oxygen sources.  Medications Added to Medication List This Visit: 1)  Plavix 75 Mg Tabs (Clopidogrel bisulfate) .Marland Kitchen.. 1 tablet every other day 2)  Aspirin 81 Mg Tabs (Aspirin) .Marland Kitchen.. 1-2 tabs once daily  Other Orders: Est. Patient Level III (04540) DME Referral (DME)  Patient Instructions: 1)  will have advanced show you more portable oxygen systems. 2)  no change in breathing meds. 3)  consider pulmonary rehab. 4)  followup with me in 6mos.   Immunization History:  Influenza Immunization History:    Influenza:  historical (06/28/2010)  Pneumovax Immunization History:    Pneumovax:  historical (06/28/2010)

## 2010-10-21 NOTE — Miscellaneous (Signed)
Summary: Orders Update  Clinical Lists Changes  Problems: Added new problem of CAROTID ARTERY DISEASE (ICD-433.10) Orders: Added new Test order of Carotid Duplex (Carotid Duplex) - Signed 

## 2010-10-21 NOTE — Letter (Signed)
Summary: Generic Letter  Architectural technologist, Main Office  [address]   [place], [place] [address]   Phone: [phone]  Fax: [phone]        October 08, 2010 MRN: [id number]    [facility] [address] [place], [place]  [address]    Dear [name],        I hope you are doing well since your hospitalization. I wanted to let you know the ultasound you had in our office of your carotid arteries looked good. There is blockage in both those arteries in your neck but it is 0-39%. This is considered normal and we will follow this with a repeat ultrasound in 2 years. Please call with any questions or concerns.   Sincerely,  Deliah Goody, RN/Dr Charlton Haws

## 2010-10-21 NOTE — Progress Notes (Signed)
Summary: PT IN [facility]  Phone Note Call from Patient   Caller: Son/STEVE Summary of Call: PT IS IN [facility] RM 1232. PT SON STATES PT WOULD LIKE FOR DR [name] TO CALL OR COME TO SEE RE HER MEDS. PT SON STATES THEY ARE TRYING TO GIVE HER NEW MEDS.  Initial call taken by: Roe Coombs,  September 30, 2010 5:01 PM  Follow-up for Phone Call        cardmaster aware. spoke to someone at the pts home and left a message we were aware Deliah Goody, RN  September 30, 2010 5:42 PM

## 2010-11-03 NOTE — Consult Note (Signed)
[name], [name]               ACCOUNT NO.:  [id number]  MEDICAL RECORD NO.:  [id number]           PATIENT TYPE:  LOCATION:                                 FACILITY:  PHYSICIAN:  Karn Cassis, MD  DATE OF BIRTH:  Feb 02, 1927  DATE OF CONSULTATION:  09/25/2010 DATE OF DISCHARGE:                                CONSULTATION   CARDIOLOGIST:  [name]. [name], MD, [facility].  PRIMARY CARE DOCTOR:  Dr. Margot Ables.  REASON FOR CONSULTATION:  We are seeing [name] in consultation with [facility]ad hospitalist for preoperative evaluation prior to orthopedic surgery for spinal fracture involving the proximal right femur.  HISTORY OF PRESENT ILLNESS:  [name] is an 77 year old lady with history of stable coronary artery disease, hypertension, hyperlipidemia who was admitted to the hospital after she suffered a mechanical fall. She reports that she twisted her ankle and fell on her right hip.  She denies any history of syncope or loss of consciousness associated with the fall.  She also denies trauma to the head.  She had absolutely no symptoms of chest pain, chest pressure, palpitations prior to the fall. As a result of fall, she suffered a spinal fracture of the proximal right femur.  She is scheduled for hip surgery.  We are being asked to consult for preoperative cardiac risk evaluation.  On my questioning, the patient reports that she lives independently with her husband at home.  She is able to perform activities of daily living with no assistance.  She walks with a cane but is able to do light housework, light yard work, and able to go for grocery shopping with minimal assistance.  She has no symptoms of angina or heart failure with above-mentioned activities.  PAST MEDICAL HISTORY: 1. Coronary artery disease status post cath in 2006 which showed 50 to     60% stenosis of the proximal one third of the vessel, 70% stenosis     of the mid-LAD, and 40% proximal left circumflex.   She is being     managed medically. 2. COPD. 3. Hypertension. 4. Gastroesophageal reflux disease. 5. Hyperlipidemia. 6. TIA. 7. Femur fracture in 1997. 8. History of squamous cell carcinoma of the face, status post     resection.  History of basal cell carcinoma of the forehead status     post resection.  Most recent echo showed an EF of 65%.  CURRENT MEDICATIONS: 1. Plavix 75 mg once a day. 2. Lasix 20 mg once a day. 3. Potassium chloride 20 mEq once a day. 4. Nexium 40 mg once a day. 5. Enalapril 5 mg once a day. 6. Isosorbide mononitrate 30 mg once a day. 7. Vytorin 10/40 once a day. 8. Aspirin 81 mg once a day. 9. Effexor 75 mg once a day. 10.Gabapentin.  SOCIAL HISTORY:  Positive for history of smoking, quit approximately 30 years ago.  No history of alcohol use or illicit drug use.  FAMILY HISTORY:  Mother had history of hypertension and CVA.  Father had history of prostate cancer.  Sister had history of lung cancer.  REVIEW OF SYSTEMS:  Twelve-point review  of systems is negative except for right hip pain.  She uses continues 2 liters of home oxygen for history of COPD.  PHYSICAL EXAMINATION:  VITAL SIGNS:  Her temperature is 97.9, pulse of 95 on admission currently is 75, respiratory rate 20, and saturating 96% on nasal cannula.  Her initial blood pressure was 166/91. GENERAL:  The patient is a well-appearing elderly lady in no apparent distress. HEENT:  Sclerae clear.  Extraocular movements are intact. NECK:  Supple.  No bruit.  No JVD. CARDIOVASCULAR:  Regular rate and rhythm.  Normal S1 and S2.  No murmurs, gallops, or rubs. LUNGS:  Lungs are clear to auscultate bilaterally.  She has diminished breath sounds at the bases. SKIN:  No rashes or lesions. ABDOMEN:  Soft and nontender. EXTREMITIES:  She has 1+ pitting edema.  No signs of clubbing or cyanosis. NEURO:  Alert and oriented to time, place, and person. MUSCULOSKELETAL:  Right hip pain.  RADIOLOGY:   She had right proximal spiral fracture of the femur as reported in the HPI above.  EKG shows normal sinus rhythm with first-degree AV block, left axis deviation, and left atrial enlargement.  Labs:  Cardiac markers x1 are negative.  Her CBC is unremarkable. Chemistry is unremarkable.  Her bicarb is 29 and creatinine of 0.7.  ASSESSMENT/PLAN:  In summary, this is an 77 year old lady with history of stable coronary artery disease, no active cardiac symptoms, with a good functional status with MET equal to 4.  She is going for intermediate risk surgery.  Further risk stratification will not change management.  Proceed to surgery. 1. I agree with your decision to start her on beta blockers as you     have already done with metoprolol. 2. I recommend restarting her antiplatelet postoperatively whenever it     is safe to do so from surgical bleeding perspective.     Karn Cassis, MD     PV/MEDQ  D:  09/25/2010  T:  09/25/2010  Job:  [id number]  Electronically Signed by Karn Cassis MD on 11/03/2010 01:22:34 PM

## 2011-02-01 NOTE — Assessment & Plan Note (Signed)
[facility]                            CARDIOLOGY OFFICE NOTE   KENNETHIA, LYNES                      MRN:          [id number]  DATE:08/27/2007                            DOB:          02/05/1927    [name] is a pleasant 77 year old patient referred for dyspnea.   The patient has had long-standing COPD and emphysema that is poorly  characterized.  She does not have recent PFTs.  She does have albuterol,  Spiriva, and Symbicort at home.   She is a previous smoker, stopping in 2006.  She has had a flu shot and  has not had any recent history of coughing, sputum production,  pneumonia, or history of DVT or PE.   There is a questionable history of an MI back in 2006 when the patient  was actually cared for by Dr. Delfin Edis.  Apparently, there were  borderline enzyme elevations, but on catheterization, she had no  significant coronary artery disease.  She had an EF of 60-70%.  His cath  note indicated that she had a calcified LAD with 70% mid LAD lesion and  a 50-60% distal right coronary artery lesion.   Medical therapy was warranted.   The patient has been having increasing exertional dyspnea.  She seems to  get short of breath with less activity.  There has been no PND or  orthopnea.  No evidence of heart failure.  She coughs on occasion, but  has not had sputum production or fever.   Again, she has not had any real recent workup for either her heart or  her cardiac problems.   REVIEW OF SYSTEMS:  Otherwise, negative.   PAST MEDICAL HISTORY:  Remarkable for a hemorrhoidectomy in 1970, right  breast cystic mass removal in 1971.  Left knee Baker's cyst surgery in  1993.  She has an admission for what was called a COPD exacerbation in  2006 and a cath with moderate disease.  She has always had good left  ventricular function.   The patient has some arthritis.  History of reflux.  Hiatal hernia.   FAMILY HISTORY:  Remarkable for high  blood pressure in her mother's  side.   Her mother is deceased at age 43 from a stroke.  Father deceased at age  22 from cancer.   The patient is married.  Her husband's health is a little bit bad with a  bad back.  She has 1 child.  She is sedentary.  She stopped smoking in  2006 and does not drink.   MEDICATIONS:  1. Plavix 75 a day for a previous TIA that she has had.  2. Lasix 20 a day.  3. Klor-Con 20 a day.  4. Nexium 40 a day.  5. Enalapril 5 b.i.d.  6. Isordil 30 a day.  7. Metoprolol 50 b.i.d.  8. Gabapentin 200 t.i.d.  9. Effexor 75 a day.  10.Vytorin 10/40.  11.Spiriva.  12.Symbicort.  13.Aspirin a day.   ALLERGIES:  SULFA.  CODEINE.  ACTONEL which causes stomach upset.   EXAM:  Remarkable for an elderly kyphotic  white female in no distress.  Affect is appropriate.  Weight is 190, blood pressure 150/80, pulse 87 and regular.  Respiratory  rate 18.  She has significant dyspnea just with ambulation from the hall into the  exam room.  HEENT unremarkable.  Carotids have no bruit.  No lymphadenopathy.  No thyromegaly.  No JVP  elevation.  LUNGS:  Inspiratory rhonchi and crackles throughout.  There is an S1, S2 with distant heart sounds.  No obvious valvular heart  disease.  ABDOMEN:  Benign.  Bowel sounds positive.  No AAA.  No  hepatosplenomegaly or hepatojugular reflux.  No tenderness.  No bruit.  Distal pulses intact.  No edema.  NEURO:  Nonfocal.  No muscular weakness.  SKIN:  Warm and dry.   An EKG from Dr. Harlene Salts office dated August 09, 2007 shows poor R wave  progression and incomplete right bundle branch block.   IMPRESSION:  1. Dyspnea, likely related to chronic obstructive pulmonary disease.      Continue Symbicort and beta agonist.  I would like to have her see      our pulmonary doctors since she appears to have fairly significant      chronic obstructive pulmonary disease.  She may be a candidate for      home O2.  She has had a flu shot and  we will get formal PFTs pre      and post bronchodilator.  2. History of moderate coronary disease per Dr. Weyman Croon Tennant's      catheterization in 2006 with normal left ventricular function.      Followup adenosine Myoview study.  Rule out progression of the      disease.  I do not think that her exertional dyspnea is an anginal      equivalent, but this is certainly a possibility.  Continue aspirin,      Plavix, and beta blocker.  3. History of transient ischemic attack currently on Plavix.  Check      carotid duplex to rule out significant carotid disease.  4. Hypercholesterolemia in the setting of moderate coronary artery      disease and previous transient ischemic attack.  Continue Vytorin      10/40, lipid and liver profile in 6 months.  5. Dyspnea in the setting of chronic obstructive pulmonary disease.      Check 2D echo and rule out Cor pulmonale.  Assess right ventricular      and left ventricular function.  Rule out occult valvular heart      disease.  6. History of anxiety and depression.  Continue Effexor 75 mg a day.      Follow up with Dr. Purnell Shoemaker.  7. History of reflux with hiatal hernia.  Continue Nexium 40 a day.      Low spice diet and avoid late night meals.  8. Osteoporosis with significant kyphosis of the spine.  Probably      contributing some to her shortness of breath with restrictive      quality to her lung exam.  Continue Caltrate.  Bone density scan      per Dr. Purnell Shoemaker.   I will see the patient back after these tests are done, but again, I  have a suspicion that her dyspnea is more pulmonary related.     [name]. [name], MD, [facility]  Electronically Signed    PCN/MedQ  DD: 08/27/2007  DT: 08/27/2007  Job #: [id number]   cc:   Lianne Bushy, M.D.

## 2011-02-01 NOTE — Assessment & Plan Note (Signed)
[facility]                            CARDIOLOGY OFFICE NOTE   [name], [name]                      MRN:          [id number]  DATE:05/22/2008                            DOB:          18-Jun-1927    [name] returns today for followup.  She has moderate coronary artery  disease with a calcified 60-70% LAD and 60-70% RCA.  They are not  particularly amenable to angioplasty.  She is not having chest pain.  Her primary problem is severe emphysema, on home O2.  Since she is  currently not having chest pain in her Myoview study done September 07, 2007, was nonischemic, we have decided to treat her medically.  She is  not a bypass candidate.   The patient has been compliant with her medications.   The patient ambulates with a cane.  She has not always been wearing her  O2 at night.  I explained to her the benefits of wearing it for 8-12  hours at night in regards to pulmonary vasodilatation.   Otherwise, she has not had a significant cough.  She needs to get her  flu shot at Dr. Harlene Salts office.  She lives in [place] and this would be  most convenient for her.  I will get her a followup appointment with  pulmonary since she does not have one.   MEDICATIONS:  1. Plavix 75 a day.  2. Lasix 20 a day.  3. Klor-Con 20 a day.  4. Nexium 40 a day.  5. Enalapril 5 mg a day.  6. Isosorbide 30 day.  7. Gabapentin.  8. Effexor 75 a day.  9. Vytorin 10/40.  10.Spiriva.  11.Symbicort.  12.Albuterol.  13.An aspirin a day.  14.Caltrate.  15.Centrum.  16.Omega vitamins.   PHYSICAL EXAMINATION:  GENERAL:  Remarkable for kyphotic elderly  overweight female, in no distress.  VITAL SIGNS:  Weight is 189, blood pressure is 110/63, pulse 80 and  regular, respiratory rate 14, afebrile.  HEENT:  Unremarkable.  NECK:  Carotids normal without bruit.  No lymphadenopathy, thyromegaly  or JVP elevation.  LUNGS:  Inspiratory rhonchi.  Occasional wheeze.   Diaphragmatic motion  is somewhat decreased.  HEART:  S1 and S2.  Distant heart sounds.  I cannot feel an RV heave.  ABDOMEN:  Benign.  Bowel sounds positive.  No AAA.  No tenderness.  No  bruit.  No hepatosplenomegaly or hepatojugular reflux.  No tenderness.  EXTREMITIES:  Distal pulses are intact.  No edema.  NEURO:  Nonfocal.  SKIN:  Warm and dry.  MUSCULOSKELETAL:  No muscular weakness.   IMPRESSION:  1. Coronary artery disease, not having angina, nonischemic Myoview.      Continue aspirin, Plavix and beta-blocker.  Given the patient's      overall general medical condition and emphysema, conservative      therapy is warranted.  2. Chronic obstructive pulmonary disease.  Follow up with Dr. Delford Field.      Continue home O2.  Flu shot at Dr. Harlene Salts office.  3. Hypertension, currently well controlled.  Continue current dose of  medications including angiotensin-converting enzyme inhibitor.  4. Previous lower extremity edema, likely from mild cor pulmonale.      Continue Lasix.  Currently appears euvolemic.  5. History of reflux.  Continue Nexium.  Avoid late-night meals and      recumbency with full stomach.  6. Hyperlipidemia in the setting of coronary artery disease.  Continue      Vytorin.  Lipid and liver profile in 6 months at Dr. Harlene Salts      office.     [name]. [name], MD, [facility]  Electronically Signed    PCN/MedQ  DD: 05/22/2008  DT: 05/23/2008  Job #: [id number]

## 2011-02-01 NOTE — Assessment & Plan Note (Signed)
[facility]                            CARDIOLOGY OFFICE NOTE   [name], [name]                      MRN:          [id number]  DATE:10/05/2007                            DOB:          [birth year]    [name] returns today for follow-up.  She is a previous patient of Delfin Edis and she has moderate coronary disease by cath in 2006.  At that  time she had 50% - 60% proximal right coronary artery disease and 60% -  70% diffuse mid LAD disease.  She is not having chest pain.  She had a  Myoview on September 07, 2007 which was nonischemic with an EF of 82%.   She also had a 2D echocardiogram which showed an EF of 65% without  significant valvular heart disease.   The patient has other complicated medical problems.  She has been seen  by Mohs surgeon up at [facility] and has had multiple procedures on her head.  She currently has Steri-Strips on the right side of her forehead.  She  has venous insufficiency with lower extremity edema and has been using  some sort of cortisol cream to the legs and her Lasix has been increased   Patient has severe COPD on home oxygen and just saw Dr. Shelle Iron.  I have  reviewed her PFTs at length and her FEV1 is only 1.14, she has limited  bronchoreversibility.  Fortunately she has not had a URI, cough, sputum  production or fever.  She has gotten her flu shot   She has been compliant with her medications.   MEDICATIONS:  1. Plavix 75 a day.  2. Lasix 40 a day.  3. Klor-Con 20 a day.  4. Nexium 40 a day.  5. Enalapril 5 b.i.d.  6. Isosorbide 30 a day.  7. Gabapentin 300 t.i.d.  8. Effexor 75 a day.  9. Vytorin 10/40.  10.Spiriva.  11.Symbicort.  12.An aspirin a day.  13.Caltrate.  14.Centrum.  15.Omega vitamins.  16.Metoprolol 50 a day.   EXAM:  Remarkable for a chronically ill-appearing elderly white female  in no distress.  Blood pressure is 120/70, pulse is 88 and regular,  respiratory rate 18, afebrile.  HEENT:  Unremarkable.  Carotids are normal without bruit; no  lymphadenopathy, thyromegaly, JVP elevation.  She has some kyphosis.  LUNGS:  Have poor diaphragmatic motion, no active wheezing.  S1 and S2, normal heart sounds, PMI normal.  ABDOMEN:  Benign, bowel sounds positive, no bruit, no tenderness, no  hepatosplenomegaly or hepatojugular reflux.  Distal pulses are intact.  She has +1 edema bilaterally with mild  erythema in the left lower extremity and bilateral venous insufficiency.   EKG shows sinus rhythm with right bundle branch block and pulmonary  disease pattern.   IMPRESSION:  1. Moderate coronary disease.  A low-risk Myoview, continue aspirin,      Plavix and beta blocker.  She would only be a candidate for medical      therapy or stenting.  She is not a coronary artery bypass grafting      candidate.  Possible follow-up  Myoview in a year.  2. Severe chronic obstructive pulmonary disease, follow with Dr.      Shelle Iron.  Continues Spiriva, Symbicort and pulse steroids as needed.  3. Lower extremity edema and venous insufficiency.  Continue Lasix 40      a day with potassium replacement, follow-up at [facility].  4. Multiple previous surgeries for basal cell carcinoma.  Wounds seem      to be healing well.  Follow-up with [facility].  5. History of reflux, continue Nexium 40 a day.  6. Hyperlipidemia in the setting of moderate coronary disease.      Continue Vytorin 10/40, follow-up lipid and liver profile in 6      months.   Overall, the patient seems to be doing well but has multiple significant  medical problems.  The most worrisome of which is her severe COPD.     [name]. Amber Emms, MD, [facility]  Electronically Signed    PCN/MedQ  DD: 10/05/2007  DT: 10/05/2007  Job #: [id number]

## 2011-02-04 NOTE — Consult Note (Signed)
[name], [name]               ACCOUNT NO.:  [id number]   MEDICAL RECORD NO.:  [id number]          PATIENT TYPE:  OUT   LOCATION:  GYN                          FACILITY:  [facility]   PHYSICIAN:  Paola A. Duard Brady, MD    DATE OF BIRTH:  1927/09/17   DATE OF CONSULTATION:  01/09/2007  DATE OF DISCHARGE:                                 CONSULTATION   The patient is a 77 year old, gravida 3, para 1, abortus 1, who has been  menopausal for approximately 52 years.  She had an episode of  diverticulitis in June 2007, was seen by her physician and was sent to  the emergency room. She had a CT scan done at that time that revealed  ovarian cyst.  She underwent transvaginal ultrasound in November 2007  that revealed a large cyst in the right measuring 3 x 2.1 x 2.5 cm. The  second cyst on the right measured 2.3 x 1.4 x 1.6 cm with thin  septations.  The smaller cyst may have contained an area of slight mural  nodularity.  The left ovary consisted of multiple cysts with a largest  one being 1.7 x 1.5 x 1.4 cm with no free fluid and a normal CA-125. At  the time of her last visit with me, she was very adamant regarding not  wishing to proceed with surgery.  She was interested in obtaining a  cardiologist as she felt that she needed one as she had one at the [facility] and currently does not have one. After a very lengthy  discussion and multiple times spent in answering her multiple questions,  she opted for close followup.  She subsequently underwent a transvaginal  ultrasound on December 25, 2006.  This overall showed a mild increase in the  size of the multicystic area in the right adnexa which was now 5.6 x 2.8  x 2.8 cm compared to 4.3 x 2.3 x 2.2 cm.  They appeared similar with no  mention of any nodularity.  Within the left adnexa, she had a 2.9 x 1.9  x 1.5 cm cyst which was previously 3 x 2.6 x 1.97 cm.  There was no free  fluid.  She comes in for exam and discussion of these results.   She is  overall doing quite well and denies any complaints.   REVIEW OF SYSTEMS:  She denies any chest pain.  She does have some  shortness of breath with exertion. She denies the use of a significant  number pillows or orthopnea.  She states that the shortness of breath  she has she has had since her myocardial infarction in February 2006.  She has had no chest pain, no nausea, vomiting, fevers, chills,  headaches or visual changes.  She denies any abdominal pain or early  satiety.  She has any significant change in bowel or bladder habits,  though she does continue to have issues with urinary frequency and  urgency.   PAST MEDICAL HISTORY:  1. Hypertension.  2. Diverticulosis.  3  TIAs in 1980s and 2000.  1. MI in February  2006.  2. Femur fracture in 1997.   MEDICATION LIST:  Again, she forgot to bring it.   ALLERGIES:  SULFA and CELEBREX.   PHYSICAL EXAMINATION:  VITAL SIGNS:  Weight 184 pounds, which is up 9  pounds from her last visit.  GENERAL:  Well-nourished, well-developed female in no acute distress.  NECK:  Supple.  There is no lymphadenopathy, no thyromegaly.  LUNGS:  Clear to auscultation bilaterally.  CARDIOVASCULAR:  Regular rate and rhythm.  ABDOMEN:  Obese, soft, nontender, nondistended.  There are no palpable  masses or hepatosplenomegaly.  Exam is somewhat limited by habitus.  Groins are negative for adenopathy.  PELVIC:  Bimanual examination: The cervix is palpably normal.  The  corpus does not appear to be appreciably enlarged.  There are no adnexal  masses appreciated, but exam is limited by habitus.  RECTAL:  Confirms. There is no nodularity.   ASSESSMENT:  A 77 year old with persistent ovarian cyst.  The right  ovarian cyst appears to have increased slightly, and the left ovarian  cyst appears to have decreased slightly. Again, lengthy discussion was  held with the patient.  We had again review the fact that she had a Pap  smear and CA-125 in December  2007.  She had not recalled these being  performed in Dr. Ebony Hail office.  Again, we reviewed the data that we  currently have.  I do believe that we need to follow up on these ovarian  masses, and there was a small amount of increase on the right side, but  it was small. There is no comment regarding vascularity or nodularity.  We will proceed with a CA-125 today.  If the CA-125 is normal, she can  return to see [place] in 3 months for followup ultrasound, and then we will  revisit the issue regarding need for surgical intervention or continued  surveillance and followup.      Paola A. Duard Brady, MD  Electronically Signed     PAG/MEDQ  D:  01/09/2007  T:  01/09/2007  Job:  [id number]   cc:   Lianne Bushy, M.D.  Fax: [phone]   Malachi Pro. Ambrose Mantle, M.D.  Fax: [phone]   Telford Nab, R.N.  [address]  [place], [place] [address]

## 2011-02-04 NOTE — H&P (Signed)
[name], [name] NO.:  [id number]   MEDICAL RECORD NO.:  [id number]          PATIENT TYPE:  INP   LOCATION:  2037                         FACILITY:  [facility]   PHYSICIAN:  Wayne A. Sheffield Slider, M.D.    DATE OF BIRTH:  [birth year]   DATE OF ADMISSION:  11/01/2004  DATE OF DISCHARGE:                                HISTORY & PHYSICAL   CHIEF COMPLAINT:  Shortness of breath.   HISTORY OF PRESENT ILLNESS:  This is a 77 year old female with history of  COPD, hypertension, and TIA who presents with a three day history of  progressive shortness of breath associated with coughing with increased  sputum production.  Patient also developed substernal chest pain pleuritic  in nature.  There is no history of fever, orthopnea, swelling in lower  extremities, nausea, vomiting, or abdominal pain.   PAST MEDICAL HISTORY:  1.  Chronic obstructive pulmonary disease.  2.  Hypertension.  3.  History of transient ischemic attack.  4.  Compression fracture of a vertebra.  5.  Osteoporosis.  6.  Squamous cell carcinoma on the face.  7.  Basal cell carcinoma on the forehead.   ALLERGIES:  CODEINE and SULFA drugs.   MEDICATIONS:  1.  Albuterol MDI as necessary.  2.  Furosemide 20 mg p.o. daily.  3.  Neurontin 300 mg p.o. b.i.d.  4.  Enalapril 5 mg p.o. daily.  5.  Plavix 75 mg p.o. daily.  6.  Aspirin 81 mg p.o. daily.  7.  Evista as instructed.   FAMILY HISTORY:  Prostate cancer in the father.  Lung cancer and colonic CA  in a sister.  Hypertension and CVA in mother.   SOCIAL HISTORY:  Patient lives with her husband.  Smokes one pack a day for  the past 37 years.  No alcohol use.   REVIEW OF SYSTEMS:  A 12-system review negative except for shortness of  breath, cough, increased sputum production, and pleuritic chest pain.   PHYSICAL EXAMINATION:  VITAL SIGNS:  Blood pressure 113/43, pulse rate of  128, respiratory rate 28, temperature 99.5, O2 saturation 99% on 2 L of  oxygen.  GENERAL:  Patient is alert, communicative, cannot talk in full sentences  because of mild respiratory distress.  HEENT:  Normocephalic, atraumatic.  Pupils equally round and reactive to  light bilaterally.  Tympanic membranes with normal landmarks.  No tonsillar  or pharyngeal congestion.  NECK:  No thyromegaly.  No carotid bruits.  No cervical lymphadenopathy.  CVS:  Tachycardic.  No murmurs.  RESPIRATORY:  Decreased breath sounds globally.  There is note of diffuse  end-expiratory wheezes with bibasilar crackles, prolonged expiratory phase  with increased work of breathing.  ABDOMEN:  Normal bowel sounds.  Soft.  No masses.  No tenderness.  EXTREMITIES:  No edema, cyanosis, or clubbing.  Peripheral pulses 2+.  NEUROLOGIC:  Cranial nerves II-XII intact.  Motor strength 5/5 in both upper  and lower extremities.  Reflexes 1+ bilaterally.   LABORATORIES:  Hemoglobin 94, hematocrit 0.40, WBC 8.9, platelet 291.  Sodium 129, potassium 3.5, chloride 96, CO2 25,  glucose 164, BUN 5,  creatinine 0.6, calcium 8.1, total protein 5.9, albumin 2.9, AST 34, ALT 21,  alkaline phosphatase 62, total bilirubin 0.4.  Initial EKG showed atrial  fibrillation with rapid ventricular response.  Chest x-ray showed COPD and  emphysema with no acute infiltrates or congestion.   ASSESSMENT:  This is a 77 year old white female with COPD and hypertension  who presents with shortness of breath.  1.  Shortness of breath.  Differential diagnosis includes chronic      obstructive pulmonary disease exacerbation which is the most likely      diagnosis.  Will start prednisone 40 mg p.o. daily, albuterol and      Atrovent nebulizations, and doxycycline 100 mg p.o. b.i.d.  Will obtain      O2 at 2 L/minute by nasal cannula to obtain O2 saturations more than      90%.  2.  Chest pain, rule out myocardial infarction.  Will cycle cardiac enzymes      and repeat EKG in the morning.  Patient initially had an EKG  suggestive      of atrial fibrillation with rapid ventricular response now in sinus      rhythm.  Will check TSH and BNP to rule out the possibility of      congestive heart failure.  Will schedule for a 2-D echocardiogram to      determine cardiac function.  Will monitor the patient on telemetry.  If      there is recurrence or persistence of atrial fibrillation, will need to      start diltiazem for rate control.  3.  Hypertension.  Will monitor blood pressure.  Will restart      antihypertensive medications if blood pressure shows an increasing      trend.  4.  Hyponatremia.  Will check serum osmolality.  If serum osmolality is low,      will send sample of __________ and sodium.  Patient looks clinically      euvolemic.  Will check BMET in the a.m.  5.  Fluids, electrolytes, nutrition.  Low fat, low salt diet.  Heart healthy      diet.  IVF to [facility].      MC/MEDQ  D:  11/02/2004  T:  11/02/2004  Job:  [id number]   cc:   Lianne Bushy, M.D.  [address]  [place]  [place] [address]  Fax: [phone]

## 2011-02-04 NOTE — Op Note (Signed)
NAMEPRINCE, OLIVIER NO.:  [id number]   MEDICAL RECORD NO.:  [id number]          PATIENT TYPE:  AMB   LOCATION:  ENDO                         FACILITY:  [facility]   PHYSICIAN:  Danise Edge, M.D.   DATE OF BIRTH:  29-May-1927   DATE OF PROCEDURE:  06/07/2005  DATE OF DISCHARGE:                                 OPERATIVE REPORT   PROCEDURE:  Diagnostic colonoscopy.   REFERRING PHYSICIAN:  Dr. Ned Grace, in [place].   PROCEDURE INDICATION:  Ms. Coree Riester is a 77 year old female, born  September 02, 1927.  While taking aspirin and Plavix, Ms. Sheahan had a few  episodes of painless hematochezia which resolved after stopping her aspirin  and Plavix.   ENDOSCOPIST:  Dr. Reece Agar   PREMEDICATION:  1.  Demerol 40 mg.  2.  Versed 5 mg.   PROCEDURE:  After obtaining informed consent, Ms. Ziesmer was placed in the  left lateral decubitus position.  I administered intravenous Demerol and  intravenous Versed to achieve conscious sedation for the procedure.  The  patient's blood pressure, oxygen saturation, and cardiac rhythm were  monitored throughout the procedure and documented in the medical record.   Anal inspection and digital rectal exam were normal.  The Olympus adjustable  pediatric colonoscope was introduced into the rectum and advanced to the  cecum.  Colonic preparation for the exam today was excellent.   Rectum normal.  Retroflexed view of the distal rectum normal.  Sigmoid colon and descending colon normal except for diverticulosis.  Splenic flexure normal.  Transverse colon.  Diverticulosis.  Hepatic flexure normal.  Ascending colon.  Diverticulosis.  Cecum and ileocecal valve normal.   ASSESSMENT:  Universal colonic diverticulosis; otherwise normal  proctocolonoscopy to the cecum.   RECOMMENDATIONS:  I suspect Ms. Lagasse had an episode of diverticular  bleeding.  She would like to go back on her aspirin and Plavix.  If she has  another episode of lower gastrointestinal bleeding, I would recommend at  least stopping the Plavix and maybe keeping her on just 81 mg aspirin.           ______________________________  Danise Edge, M.D.     MJ/MEDQ  D:  06/07/2005  T:  06/07/2005  Job:  [id number]   cc:   Lianne Bushy, M.D.  [address]  [place]  [place] [address]  Fax: [phone]

## 2011-02-04 NOTE — Op Note (Signed)
NAME:  [name], [name]                         ACCOUNT NO.:  [id number]   MEDICAL RECORD NO.:  [id number]                   PATIENT TYPE:  OIB   LOCATION:  2899                                 FACILITY:  [facility]   PHYSICIAN:  Guadelupe Sabin, M.D.             DATE OF BIRTH:  1926/10/30   DATE OF PROCEDURE:  08/29/2003  DATE OF DISCHARGE:                                 OPERATIVE REPORT   PREOPERATIVE DIAGNOSIS:  Senile nuclear cataract, right eye.   POSTOPERATIVE DIAGNOSIS:  Senile nuclear cataract, right eye.   NAME OF OPERATION:  Planned extracapsular cataract extraction-  phacoemulsification, primary insertion of posterior chamber intraocular lens  implant.   SURGEON:  Dr. Cecilie Kicks.   ASSISTANT:  Nurse.   ANESTHESIA:  Local 4% Xylocaine, 0.75% Marcaine, retrobulbar block with  Wydase added, topical tetracaine, intraocular Xylocaine. Patient had  anesthesia standby required and required sodium Pentothal administration  during the period of retrobulbar injection   OPERATIVE PROCEDURE:  After the patient was prepped and draped, a lid  speculum was inserted in the right eye. Schiotz tonometry was recorded at 5  scale units with a 5.5 g weight. A peritomy was performed adjacent to the  limbus from the 11 to 1 o'clock position. The corneoscleral junction was  cleaned and a corneoscleral groove made with a 45-degree super blade. The  anterior chamber was then entered with a 2.5-mm diamond keratome at the 12  o'clock position and the 15-degree blade at the 2:30 position. Using a 27-  gauge needle on an OcuCoat syringe, a circular capsulorrhexis was begun and  then completed with the Graybow forceps. Hydrodissection and  hydrodelineation were performed using 1% Xylocaine. The 30-degree  phacoemulsification tip was then inserted with slow-controlled  phacoemulsification of the lens nucleus. Total ultrasonic time 1 minute 30  seconds. Average power level 18%.  Following removal of the  nucleus, the  residual cortex was aspirated with the irrigation/aspiration tip. The  posterior capsule appeared intact with a brilliant red fundus reflex. It was  therefore elected to insert an [employer]er[employer] medical optics model SI40NB silicone  three piece posterior chamber intraocular lens implant, diopter strength  +21.00. This was inserted with the McDonald forceps into the anterior  chamber and then centered into the capsular bag using the [facility] lens  rotator. The lens appeared to be well centered. The Healon  which had been  used throughout the procedure with OcuCoat was then aspirated and replaced  with balanced salt solution and Miochol ophthalmic solution. The operative  incision appeared intact, but it was elected to place one 10-0 interrupted  nylon suture across the incision to prevent leakage and possible  endophthalmitis. Maxitrol ointment was instilled in the anterior  conjunctival cul-de-sac. A light patch and protector shield were applied.  Duration of procedure and anesthesia administration 45 minutes. The patient  tolerated the procedure well in general and left the operating room for the  recovery room in good condition.                                               Guadelupe Sabin, M.D.    HNJ/MEDQ  D:  08/29/2003  T:  08/30/2003  Job:  [id number]

## 2011-02-04 NOTE — H&P (Signed)
[name], ELIZARRARAZ NO.:  [id number]   MEDICAL RECORD NO.:  [id number]          PATIENT TYPE:  INP   LOCATION:  1827                         FACILITY:  [facility]   PHYSICIAN:  Hollice Espy, M.D.DATE OF BIRTH:  Nov 12, 1926   DATE OF ADMISSION:  01/31/2006  DATE OF DISCHARGE:                                HISTORY & PHYSICAL   ATTENDING PHYSICIAN:  Corinna L. Lendell Caprice, M.D.   PRIMARY CARE PHYSICIAN:  Lianne Bushy, M.D. in [place], [place].   CHIEF COMPLAINT:  Abdominal pain.   HISTORY OF PRESENT ILLNESS:  The patient is a 77 year old white female, with  past medical history of diverticulitis, CAD status post MI and COPD, who  presents to the emergency room after several days of abdominal pain.  She  initially started having pain in her left lower quadrant about three days  ago but it became severely bad about 24 hours ago.  She was told by her PCP  to come to the emergency room for further evaluation.   In the emergency room, the patient underwent a CT of the abdomen and pelvis.  It showed signs consistent with a large diverticulitis.  The patient was  also noting some dysuria symptoms for the past day.  Given the CT findings,  it was felt that she best come in for further evaluation and treatment.  The  patient otherwise denied any other complaints.  She denies any chest pain,  headaches, vision changes, dysphagia.  She does complain of some mild back  pain secondary to an uncomfortable bed.  She denies any wheezing or  coughing.  No hematuria, no constipation or diarrhea, focal extremity  numbness, weakness or pain.  Review of systems otherwise negative.   PAST MEDICAL HISTORY:  1.  Hypertension.  2.  CAD, status post MI.  3.  COPD.  4.  Tobacco, quit one year ago.  5.  Excessive alcohol, quit many years ago.   MEDICATIONS:  1.  The patient is on diazepam 2 p.o. daily p.r.n.  2.  K-Dur 20 mEq p.o. daily.  3.  Lasix 20 p.o. daily.  4.   Enalapril 5 p.o. b.i.d.  5.  Neurontin 300 p.o. b.i.d.  6.  Spiriva 18 mcg inhaled daily.  7.  Albuterol inhaler daily.  8.  Imdur 30 mg p.o. daily.  9.  Metoprolol 50 mg p.o. b.i.d.  10. Effexor 75 p.o. daily.  11. Plavix 75 p.o. daily.  12. Vytorin 10/40 p.o. q.h.s.  13. Pepcid 1 p.o. daily.  14. Aspirin 325 mg p.o. daily.  15. Metamucil 1 p.o. daily.  16. Vitamin B12 500 mcg p.o. daily.  17. Fish oil 1000 mg p.o. daily.  18. Citracal D 1 p.o. daily.  19. MDI p.o. daily.   ALLERGIES:  The patient has a questionable allergy to IODINE since she had a  CAT scan done in the emergency room with contrast.  She has allergies to  SHELLFISH, SULFA and CODEINE.   SOCIAL HISTORY:  She denies any current tobacco, alcohol or drug use.   FAMILY HISTORY:  Noncontributory.   PHYSICAL  EXAMINATION:  VITAL SIGNS:  On admission, temperature 98.2, heart  rate 88, blood pressure 99/47 (since then has improved to 123/98,  respirations 20.  O2 saturation 92% on room air.  GENERAL:  The patient is alert and oriented x3 in no apparent distress.  HEENT:  Normocephalic and atraumatic.  Mucus membranes are slightly dry.  She has no carotid bruits.  HEART:  Regular rate and rhythm.  S1 and S2.  LUNGS:  Clear to auscultation bilaterally.  ABDOMEN:  She has some tenderness in the left lower quadrant.  Otherwise  obese, positive bowel sounds.  EXTREMITIES:  No clubbing, cyanosis, or edema.   LABORATORY DATA:  White count 13.7, H&H 13.4 and 40.3, MCV is 91, platelet  count 344, 79% neutrophils.  Sodium 132, potassium 3.3, chloride 100, bicarb  28, BUN 8, creatinine 0.9, glucose 91.  LFTs are only noted for an albumin  of 2.8 and a total bilirubin of 1.4.  Urinalysis shows a large leukocyte  esterase, 15 ketones, small bilirubin.  Coagulases normal.  Microscopic UA  shows 11 to 20 white cells, rare bacteria, but yeast.   ASSESSMENT/PLAN:  1.  Diverticulitis:  Make the patient n.p.o., pain and nausea  control and      intravenous Cipro and Flagyl.  2.  Urinary tract infection with yeast:  Put her on intravenous Diflucan.  3.  Gastroesophageal reflux disease:  Put her on intravenous proton pump      inhibitor.  4.  Hypertension:  Hold all of her oral medications and put her on      intravenous Lopressor.  5.  History of chronic obstructive pulmonary disease:  Continue Spiriva.  6.  History of coronary artery disease.      Hollice Espy, M.D.  Electronically Signed     SKK/MEDQ  D:  01/31/2006  T:  02/01/2006  Job:  [id number]   cc:   Lianne Bushy, M.D.  Fax: [phone]

## 2011-02-04 NOTE — Discharge Summary (Signed)
[name], [name]               ACCOUNT NO.:  [id number]   MEDICAL RECORD NO.:  [id number]          PATIENT TYPE:  INP   LOCATION:  6739                         FACILITY:  [facility]   PHYSICIAN:  [name], M.D.DATE OF BIRTH:  04-01-1927   DATE OF ADMISSION:  11/01/2004  DATE OF DISCHARGE:  11/16/2004                                 DISCHARGE SUMMARY   PRIMARY CARE PHYSICIAN:  [name], M.D.   CONSULTING PHYSICIANS:  1.  Dr. Colleen Can. Deborah Cain with cardiology.  2.  Dr. Sung Cain with critical care medicine.   DISCHARGE DIAGNOSES:  1.  Acute exacerbation of chronic obstructive pulmonary disease.  2.  Non-ST elevation myocardial infarction.  3.  History of hypertension.  4.  History of transient ischemic attack.  5.  History of squamous cell carcinoma of the face.  6.  Basal cell carcinoma on the forehead.   PROCEDURES:  1.  Chest x-ray dated November 01, 2004 shows findings consistent with COPD.      Chest x-rays were repeated four times, showing again stable changes of      COPD.  2.  A 2D echocardiogram performed November 02, 2004 shows overall left      ventricular systolic function between 65-75%, and a normal right      ventricle.  3.  Cardiac catheterization dated November 09, 2004 shows overall normal      left ventricular function, moderate coronary atherosclerosis, with 50-      60% narrowing of the proximal right coronary artery, 60-70% diffuse and      heavily calcified left anterior descending artery, and only mild      coronary atherosclerosis in left circumflex system, and felt it would be      best to manage [name] medically.  4.  Transfusion of 2 units packed red blood cells on November 15, 2004.   LABORATORIES:  The patient had multiple CBCs and BMPs during this  hospitalization.  The patient had an admission BNP of 237.5.   Initial cardiac markers showed CK 313, CK-MB 10.7, relative index 3.4, and a  troponin of 0.05.  Repeat showed a CK of  36, CK-MB of 11.4, and index of  3.1, and a troponin of 0.03.  Third cardiac panel showed a CK of 328, CK-MB  of 23.2, an index of 7.1, and a troponin of 1.98.  A fourth panel showed a  CK of 261, CK-MB of 20, an index of 7.7, and a troponin of 1.2.   A lipid profile obtained during the hospitalization showed cholesterol 207,  triglycerides 257, HDL 47, LDL 109.  Hemoglobin A1c in the hospitalization  showed hemoglobin A1c of 5.9.   HISTORY AND PHYSICAL:  For a complete H&P, please see the dictated H&P on  the chart, but in short the patient is a 77 year old white female with a  history of COPD, hypertension, and TIA, who presented with a three-day  history of progressive shortness of breath, with associated coughing and  increased sputum production.  The patient was admitted with a differential  diagnosis of an acute exacerbation of  COPD.  Was started on prednisone 40 mg  p.o. daily; albuterol and Atrovent nebulizers; and started on doxycycline  100 mg p.o. b.i.d., with supportive therapy consisting of oxygen.   HOSPITAL COURSE:  1.  Acute exacerbation of COPD.  Initially, the patient was started on      prednisone 40 mg p.o. daily as well as scheduled nebulizers and      doxycycline antibiotics.  However, the second day of the hospitalization      the patient had an acute event in which she became profoundly hypoxic      and stated that she felt like she was dying.  Oxygen saturations dropped      into the 70s.  The patient was transferred from the floor bed into the      unit and was placed on BIPAP, with a tenuous course, and at multiple      times it appeared like she would require intubation.  The patient was      switched over to IV Solu-Medrol as well as Xopenex and Atrovent, and      antibiotics were switched from doxycycline to Avelox and IV Rocephin.      After the initial bronchospasm passed, with supportive therapy with      BIPAP and oxygen, the patient was gradually  weaned off the BIPAP and      weaned down to at the time of discharge 3 liters by nasal cannula of      oxygen.  It was felt that the sudden deterioration was likely a      bronchospasm consistent with COPD.  In total, the patient completed a 14-      day course of steroids, first IV Solu-Medrol, and gradually tapered down      to 40 of prednisone.  This was discontinued after 14 days, and the      patient showed no obvious adrenal insufficiency after discontinuation of      the steroids.  The patient also completed a 10-day course of Avelox for      COPD exacerbation.  Gradually, the patient was weaned from q.6 h.      nebulizers to p.r.n. nebulizers.  She was then started on Spiriva at the      time of discharge as well as p.r.n. albuterol.  I would appreciate it if      [name] would determine whether or not he thinks the patient would      benefit from an inhaled steroid or a long-acting bronchodilator such as      Advair in addition to the Spiriva.  At the time of discharge, the      patient is now only on Spiriva, p.r.n. albuterol, and also supplemental      oxygen via nasal cannula.  2.  Non-ST elevation MI.  During the acute exacerbation dictated above,      cardiac panel was initially drawn, and this was cycled q.8 h. x 3.  The      patient's troponins gradually increased, and it was felt that the      patient had likely suffered a non-ST elevation MI secondary to the      strain put on the heart during the profound hypoxia.  She was started on      a heparin drip and was started on a beta blocker as well as aspirin.      Secondary to her critical nature, she was not able to undergo cardiac  catheterization until November 09, 2004.  Please see the dictated      results of the cardiac catheterization above, but in short it was felt      that the patient would best benefit from medical therapy of her coronary     artery disease.  The patient is discharged home on aspirin 81 mg  p.o.      daily; enalapril 5 mg p.o. b.i.d.; Lopressor 50 mg p.o. b.i.d.; Zocor 40      mg p.o. daily.  She was also counseled to quit smoking, which she says      she will.  I feel that the patient should benefit from being on Plavix.      However, see problem #3.  Would start the Plavix in two weeks after      discharge.  3.  Rectus sheath hematoma and right hip hematoma.  The patient was started      on IV heparin for her non-ST elevation MI.  Hemoglobins at that point      were 11.  After being on the blood thinners, it was noticed that the      patient developed a hematoma in the abdomen and also in the right hip.      This was confirmed by abdominal CT on November 07, 2004, which showed a      9.5 x 8.1 cm hematoma in the rectus sheath, and also a 5 x 5 cyst in the      right lobe of the liver.  CT also showed extensive sigmoid      diverticulosis.  Secondary to the hematoma in the rectus sheath and the      hematoma on her right hip, the patient's heparin was stopped along with      the aspirin and Plavix.  CBCs were monitored periodically to ensure that      the bleeding was stable.  Hemoglobin dropped from 11 to 8 over a period      of three days.  Given the patient's coronary artery disease, it was felt      that she would benefit from transfusion of 2 units of packed red blood      cells (also given her continued dyspnea with her COPD).  On November 14, 2004, the patient was given 2 units of packed red blood cells.      Discharge hemoglobin was 11.6 and stable.  I would appreciate it if Dr.      Purnell Cain would follow this up as an outpatient.  4.  Hypertension.  Stable in the hospital on the discharge medications.  5.  Generalized anxiety disorder.  The patient expressed a great deal of      anxiety throughout the hospitalization.  It was felt that some of her      shortness of breath could also be attributed to the anxiety along with      her organic medical problems.  She  was started initially on low-dose      Effexor, which was gradually increased to 75 mg p.o. daily, and also      given p.r.n. Ativan, which the patient benefitted from.  Will continue      the Effexor at discharge.  I would appreciate it if [name] would      determine whether or not he feels this is necessary in the future.  6.  History of TIAs.  Would resume the Plavix in two  weeks after it is      proven that the patient's hemoglobin is stable and the hematomas are      resolving.  Will leave this to [name] to do in the future.  7.  Anemia.  Hemoglobin of 11.6 on discharge.  On admission, the patient's      hemoglobin was 9.4.  Given her acute illness, it was not felt that the      patient would benefit from iron studies at this time.  Would appreciate      it if [name] would evaluate with an anemia workup as an outpatient.   DISCHARGE MEDICATIONS: 1.  Aspirin 81 mg p.o. daily.  Would continue this until the patient can      begin Plavix some time toward the end of March 2006.  2.  Vasotec 5 mg p.o. b.i.d.  3.  Neurontin 300 mg p.o. b.i.d.  4.  Imdur 30 mg p.o. daily.  5.  Lopressor 50 mg p.o. b.i.d.  6.  Protonix 40 mg p.o. daily.  7.  We are holding the Evista at the current time, and allow [name] to      resume when he feels it is safe.  8.  Zocor 40 mg p.o. daily.  9.  Spiriva 18 mcg inhaled at bedtime.  10. Effexor 75 mg p.o. daily.  11. Albuterol metered dose inhaler 2 puffs inhaled q.4 h. p.r.n.  12. O2 at a rate of 2-3 liters per minute.   Will also arrange home health PT to visit the patient and assist in rehab.   FOLLOWUP:  Will arrange followup with [name] prior to discharging the  patient from the hospital.  At that time, I would appreciate it if [name]  would evaluate for resuming the Plavix.  Also evaluate the patient's COPD  and determine what maintenance medications he feels she would most benefit  from.  Also consider an outpatient workup for  anemia.  Would also appreciate  it if [name] would determine whether or not he feels the patient benefits  from the Effexor, or will leave it to his discretion whether or not to stop  the Effexor.      WTP/MEDQ  D:  11/16/2004  T:  11/16/2004  Job:  [id number]   cc:   [name], M.D.  [address]  [place]  [place] [address]  Fax: [phone]

## 2011-02-04 NOTE — Consult Note (Signed)
[name], [name]               ACCOUNT NO.:  [id number]   MEDICAL RECORD NO.:  [id number]          PATIENT TYPE:  OUT   LOCATION:  GYN                          [facility]ACILITY:  [facility]   PHYSICIAN:  Paola A. Duard Brady, MD    DATE OF BIRTH:  04/18/1927   DATE OF CONSULTATION:  09/20/2006  DATE OF DISCHARGE:                                 CONSULTATION   PRIMARY CARE PHYSICIAN:  Lianne Bushy, M.D.   HISTORY:  The patient is seen today in consultation at the request of  Dr. Ambrose Mantle.  [name] is a 77 year old gravida 3, para 1 abortus 1,  who has menopausal for the past 52 years.  She has never had any  episodes of postmenopausal bleeding.  She was on hormone replacement  therapy in te postmenopausal period, but stopped that several years ago.  In June of 2007 she had an episode of diverticulitis.  She was seen by  her physician, had a low blood pressure; and was sent to the emergency  room.  Per her report, she had a CT scan done, at that time, that  revealed ovarian cysts.  She delayed follow up for quite some time, and  ultimately underwent a transvaginal ultrasound July 28, 2006.  The  ultrasound revealed the uterus to be 6 x 2.9 x 4.6 cm in size with an 8-  mm endometrial stripe.  There are multiple bilateral ovarian cysts.  The  largest cyst on the right measured 3 x 2.1 x 2.5 cm in size.  A second  cyst on the right measured 2.3 x 1.4 x 1.6 cm in size with thin  septations.  The smaller cyst may contain an area of slight mural  nodularity.  The left ovary consisted of multiple cysts with the largest  being 1.7 x 1.5 x 1.4 cm and having a simple appearance.  There was no  free fluid.  A CA-125 was drawn; and was normal at 14.  She comes in,  today, as a result of these findings; and it is her main disposition.  She is overall doing fairly well; and is without significant complaints,  today.  With regards to her episode of diverticulitis; it resolved with  antibiotics.   REVIEW OF  SYSTEMS:  She does complain of shortness of breath with  exertion.  This has been going on since she had her myocardial  infarction in February of 2006.  She has had no chest pain since that  time.  She states she did undergo a cardiac catheterization.  She had  minimal block.  She had no stents placed; and was managed with medical  therapy.  She denies any abdominal pain, any nausea, vomiting, bloating,  early satiety, fevers, chills, significant change in her bowel or  bladder habits.  She has a long history of urinary frequency and urgency  and those have not changed.  She states those symptoms have really  started since she had her MI, and needed to have a Foley catheter in  place; and since that time she has not been able to void normally.  She  has denies any unintentional weight loss, weight gain, headaches, or  visual changes.   PAST MEDICAL HISTORY:  1. Hypertension.  2. Diverticulosis  3. TIAs in 1980s and 2000.  4. Myocardial infarction in February of 2006.  5. Femur fracture in 1997.   MEDICATIONS:  She did not bring her list, but she believes she is on  Vasotec unknown dose, Afrin unknown dose, Lasix unknown dose, aspirin  she takes three 81 mg tablets daily, Plavix unknown dose, Evista which  she was off for some time but stopped in February of 2006.   ALLERGIES:  SULFA and CELEBREX which cause a fine rash.   PAST SURGICAL HISTORY:  1. Left knee surgery.  2. Hemorrhoidectomy.  3. Right breast cyst removal.  4. Left thigh surgery.   SOCIAL HISTORY:  She smokes one pack a day for about 25 years; she quit  in February of 2006.  She denies use of alcohol.  Her husband is an  alcoholic.   FAMILY HISTORY:  Her mother died of a stroke at the age of 48; she had  coronary disease.  Her father had prostate cancer.  She has a sister  with lung cancer.  She has a sister with questionable colonic polyps;  and a cardiac tumor that was treated with tamoxifen.   HEALTH  MAINTENANCE:  She has not had a mammogram in several years.  Her  colonoscopy was less then within 2 years ago.   PHYSICAL EXAMINATION:  VITAL SIGNS:  Weight 5 feet 1 inch, weight 175  pounds, blood pressure 118/72, pulse 92, respirations 20.  GENERAL:  A well-nourished, well-developed female in no acute distress.  NECK:  Supple; there is no lymphadenopathy, no thyromegaly.  LUNGS:  Clear to auscultation with distant breath sounds secondary to  poor inspiratory effort.  CARDIOVASCULAR EXAM:  Regular rate and rhythm.  ABDOMEN:  Soft, nontender, nondistended.  There are no palpable masses  or hepatosplenomegaly.  Groins are negative for adenopathy.  EXTREMITIES:  She has 1+ nonpitting edema equal bilaterally.  PELVIC:  External genitalia is within normal limits.  Bimanual  examination:  The cervix is palpably normal.  The corpus does not appear  appreciably enlarged; it is of normal size, shape, and consistency.  There are no adnexal masses.  RECTAL:  Confirms   ASSESSMENT:  This is a 77 year old with complex ovarian cyst normal CA-  125.  The patient states that she is not a good surgical candidate; and  she would never accept chemotherapy.  I discussed with her that we , at  this point do not know if this is an ovarian malignancy or not; I stated  that there were some concerning features, namely, her age and the  complex nature of the mass, but the normal CA-125 and lack of ascites is  reassuring.  In addition if these were noticed on her CT scan in June;  and she did not follow up until November and was no significant increase  in the size; this is also reassuring.  She states that she thought that  the ultrasound with tell definitively if this is a malignancy or not.  I  discussed with her that the ultrasound, can only tell us the size and  the structure associated with it, but it does not provide [place] with a pathologic diagnosis.  We did discuss the fact that if she decided to   proceed with surgery, we would need to have preoperative cardiac  clearance.  She states that she does not have a cardiologist, but she  feels that she needs one.  We had a lengthy discussion regarding surgery  and the pros-and-cons.  She states that she would want to know if this  is a cancer; but is not sure, again, if she would like to proceed with  surgery.  What I recommended and where we have left it, is that she will  follow up with another ultrasound as her last ultrasound was  approximately 2 months ago.  She has been scheduled for an ultrasound on  January 4 at 12:15.  The patient was given this appointment date and  time.  We will contact her with the results of the ultrasound.  I  believe if the cysts are completely the same in size, and the patient  does not wish to have surgery; we could continue following her  conservatively.  She does understand that if the cysts have gotten  larger, more complex; there is a  higher vascular flow; there is evidence of ascites on ultrasound; that  we would recommend proceeding with cardiac clearance and possible  surgery.  Her questions regarding this were answered to her  satisfaction.  She will keep her ultrasound appointment.      Paola A. Duard Brady, MD  Electronically Signed     PAG/MEDQ  D:  09/20/2006  T:  09/20/2006  Job:  [id number]   cc:   Lianne Bushy, M.D.  Fax: [phone]   Malachi Pro. Ambrose Mantle, M.D.  Fax: [phone]   Telford Nab, R.N.  [address]  [place], [place] [address]

## 2011-02-04 NOTE — H&P (Signed)
NAME:  [name], [name]                         ACCOUNT NO.:  [id number]   MEDICAL RECORD NO.:  [id number]                   PATIENT TYPE:  OIB   LOCATION:  2899                                 FACILITY:  [facility]   PHYSICIAN:  Guadelupe Sabin, M.D.             DATE OF BIRTH:  11-Nov-1926   DATE OF ADMISSION:  08/29/2003  DATE OF DISCHARGE:                                HISTORY & PHYSICAL   REASON FOR ADMISSION:  This was a planned outpatient surgical admission of  this 77 year old white female admitted for cataract implant surgery of the  right eye.   HISTORY OF PRESENT ILLNESS:  This patient has a past history of laser  photocoagulation for a retinal tear involving her left eye, performed in  [place], [place]. 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 The  patient was first seen in my office on May 17, 2003. Examination at that  time revealed a visual acuity of 20/100 right eye, 20/70 left eye with best  correction and 20/50 right eye, 20/40 with refraction. Slit-lamp examination  revealed nuclear cataract formation of both eyes. Detailed fundus  examination revealed cataract haze in both eyes. 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 At the 2 o'clock position, lattice retinal degeneration was noted  at the 2 o'clock and slight atrophic area at the 4 and 4:30 position. The  patient was given a prescription for new glasses, and the old records  requested from [facility]. She later returned and has been  followed for the increasing nuclear cataract formation. By July 10, 2003,  vision deteriorated again with best correction and refraction to 20/100  right eye, 20/50 left eye. Due to the patient's complaints of blurred vision  interfering with  her quality of life, the patient elected to proceed with  cataract implant surgery. She was given oral discussion and printed  information concerned the procedure and its possible complications. She  signed an informed consent, and arrangements were made for her outpatient  admission at this time.   PAST MEDICAL HISTORY:  The patient is under the care of Dr. Purnell Shoemaker in  [place], [place]. Dr. Purnell Shoemaker follows the patient for hypertension,  chronic arthritis, chronic bronchitis, and seasonal allergies. The patient  is a smoker of one half pack of cigarettes per day. The patient lives with  her husband.   CURRENT MEDICATIONS:  Aspirin, Plavix, Neurontin, hydrochlorothiazide,  Evista, K-Dur, multivitamins, and occasional tears ophthalmic solution for  dry eyes.   REVIEW OF SYSTEMS:  No cardiorespiratory complaints at the present time.   PHYSICAL EXAMINATION:  VITAL SIGNS:  Blood pressure 122/75, pulse 81,  respirations 18, temperature 97.3.  GENERAL APPEARANCE:  The patient is a well-developed, well-nourished, white  female in no acute distress.  HEENT:  Eye:  Visual acuity as noted above. Slit-lamp examination:  The  corneal is clear. Anterior chamber deep and clear with nuclear cataract  formation greater in the right than left eye. Detailed fundus examination as  described above.  CHEST:  Lungs clear to percussion and auscultation. No rales or rhonchi  heard.  HEART:  Normal sinus rhythm. No cardiomegaly. No murmurs.  ABDOMEN:  Negative.  EXTREMITIES:  Negative.   ADMISSION DIAGNOSES:  Senile nuclear cataract, both eyes.   SURGICAL PLAN:  Cataract implant surgery, right eye now-left eye later as  needed.                                                Guadelupe Sabin, M.D.    HNJ/MEDQ  D:  08/29/2003  T:  08/30/2003  Job:  [id number]

## 2011-02-04 NOTE — Discharge Summary (Signed)
NAMEMARGUETTA, [name]               ACCOUNT NO.:  [id number]   MEDICAL RECORD NO.:  [id number]          PATIENT TYPE:  INP   LOCATION:  5705                         FACILITY:  [facility]   PHYSICIAN:  Corinna L. Lendell Caprice, MDDATE OF BIRTH:  1927-01-20   DATE OF ADMISSION:  01/31/2006  DATE OF DISCHARGE:  02/02/2006                                 DISCHARGE SUMMARY   DISCHARGE DIAGNOSES:  1.  Diverticulitis.  2.  Hypotension.  3.  Urinary tract infection.  4.  Gastroesophageal reflux disease.  5.  History of hypertension.  6.  Chronic obstructive pulmonary disease, stable.  7.  History of coronary artery disease.  8.  Hypokalemia.   DISCHARGE MEDICATIONS:  She is to hold her metoprolol, enalapril and Lasix  for 3 days.   She may continue:  1.  Valium as needed.  2.  Kay Ciel 20 mEq a day.  3.  Neurontin 300 mg p.o. b.i.d.  4.  Spiriva inhaled daily.  5.  Albuterol inhaler as needed.  6.  Imdur 30 mg a day.  7.  Effexor XR 75 mg a day.  8.  Plavix 75 mg a day.  9.  Vytorin 10/40 mg p.o. nightly.  10. Pepcid as previous.  11. Aspirin 325 mg a day.  12. Metamucil.  13. Vitamin B12.  14. Fish oil.  15. Citracal as previous.  16. She has been started on Cipro 500 mg p.o. b.i.d. for 5 more days.  17. Flagyl 500 mg p.o. t.i.d. for 5 more days.   FOLLOWUP:  With her primary care physician, Dr. Kathrynn Running, next week.   ACTIVITY:  Ad lib.   DIET:  Low salt, low residue.   CONDITION:  Stable.   CONSULTATIONS:  None.   PROCEDURES:  None.   PERTINENT LABORATORY DATA:  CBC is significant for a white blood cell count  of 13, 79% neutrophils, otherwise unremarkable.  PT/PTT normal.  Complete  metabolic panel significant for a potassium of 3.3, albumin of 2.8, total  bilirubin of 1.4, otherwise unremarkable.  UA showed few epithelial cells,  large leukocyte esterase, negative nitrites, 11-20 white cells, rare  bacteria.  Urine culture is negative to date.   SPECIAL STUDIES AND  RADIOLOGY:  CT of the abdomen and pelvis showed  inflammation of the descending colon and sigmoid colon, possibly  representing diverticulitis, bilateral ovarian cysts will need followup  ultrasound, atherosclerotic-type changes of the aorta, coronary artery  calcifications, compression deformity of T8 and T12, minimal enlargement of  right posterior liver cyst, hiatal hernia.   HISTORY AND HOSPITAL COURSE:  Ms. [name] is a pleasant 77 year old  unassigned white female who was seen in her primary care physician's office  with abdominal pain and dizziness.  She was found to have a blood pressure  there of 78/40 and was sent to the emergency room via EMS.  When she arrived  in the emergency room, her blood pressure was 99/47.  Please see H&P for  complete admission details.  she had had several days' worth of abdominal  pain and had some tenderness in the left  lower quadrant.  She was felt to  have diverticulitis and made n.p.o., started on IV antibiotics, IV fluids.  Her antihypertensives were held.  She improved quickly and her diet was able  to be advanced.  At the time of discharge, her abdominal pain remains, but  is much better, she is tolerating a diet, she has no dizziness, her blood  pressure is fine and she is requesting to go home.  She also was found to  have a urinary tract infection; cultures to date are negative.  On the day  of discharge, she had minimal tenderness in the left lower quadrant.   She will need an outpatient workup of these ovarian cyst if not done  previously as an outpatient.      Corinna L. Lendell Caprice, MD  Electronically Signed     CLS/MEDQ  D:  02/02/2006  T:  02/03/2006  Job:  [id number]

## 2011-02-04 NOTE — Consult Note (Signed)
NAMEKASIDY, GIANINO NO.:  [id number]   MEDICAL RECORD NO.:  [id number]          PATIENT TYPE:  INP   LOCATION:  2037                         FACILITY:  [facility]   PHYSICIAN:  Colleen Can. Deborah Chalk, M.D.DATE OF BIRTH:  [birth year]   DATE OF CONSULTATION:  DATE OF DISCHARGE:                                   CONSULTATION   HISTORY:  Thank you very much for asking me to see Ms. Hanger.  She is a 77-  year-old female admitted yesterday with progressive shortness of breath.  She has developed substernal chest pressure sensation and has somewhat of a  pleuritic component.  It has been present for three days with a history of  increasing shortness of breath.  She has a history of palpitations that  started on Sunday.  She was given inhaler by primary care physician and had  worsening of symptoms on Monday and came to the emergency room.  She was  noted to be in atrial fibrillation and has slight increase in cardiac  enzymes.   PAST MEDICAL HISTORY:  History of COPD, hypertension, TIAs x 2, compression  fracture, osteoporosis, skin cancer, and recent cataract surgery.   ALLERGIES:  Codeine and sulfa.   CURRENT MEDICATIONS:  Lasix 20 mg daily, Neurontin, Enalapril 5 mg daily,  Plavix, aspirin, and Evista.   FAMILY HISTORY:  Father died of prostate cancer, mother died with CVA and  hypertension.   SOCIAL HISTORY:  She lives with her husband, this is her second marriage.  She has been a smoker for 37 years, no alcohol.  She has one son.   REVIEW OF SYMPTOMS:  She has had some palpitations, some decrease in energy,  she has some chronic edema.  She gives somewhat of a rambling history.  It  sounds like she has had an increasing URI recently, although it is certainly  somewhat hard to quantify.  She has had some dyspnea with exertion.   PHYSICAL EXAMINATION:  GENERAL:  She is dyspneic but talking.  VITAL SIGNS:  Blood pressure 119/72,  heart rate 90s.  SKIN:  Warm and  dry, color is somewhat ruddy.  LUNGS:  Prolonged expiratory phase.  HEART:  Regular rate and rhythm.  ABDOMEN:  Soft, nontender.  EXTREMITIES:  Without edema.  NEUROLOGICAL:  Intact.   LABORATORY DATA:  Chest x-ray shows flattened diaphragms, increased heart  size, chest x-ray compatible with increased markings. BNP 385.  Echo results  are pending.  Hematocrit 39, platelets 293, white count 6,000.  Chemistries  are basically all negative.  CK 313 with MB 10.7, troponin 0.05.  EKG shows  atrial fibrillation on admission but returned sinus rhythm.  She is  currently in sinus rhythm.  There are no acute changes present.  There is  left axis deviation.   IMPRESSION:  1.  Hypertensive heart disease.  2.  Chronic obstructive pulmonary disease secondary to cigarette abuse.  3.  Atrial fibrillation.  4.  Progressive shortness of breath questionably related to #2 or      intermittent #3.  5.  Borderline increasing markers.  6.  History of TIAs.   PLAN:  Will check a 2D echocardiogram, check thyroid levels, I think PFTs  with diffuse capacity and resting O2 saturations would be appropriate.  We  may need to consider cardiac catheterization to complete the workup.  I  doubt she is a candidate for Adenosine Cardiolite study.      SNT/MEDQ  D:  11/02/2004  T:  11/02/2004  Job:  [id number]

## 2011-02-04 NOTE — Cardiovascular Report (Signed)
NAMEKENYON, ESHLEMAN               ACCOUNT NO.:  [id number]   MEDICAL RECORD NO.:  [id number]          PATIENT TYPE:  INP   LOCATION:  3702                         FACILITY:  [facility]   PHYSICIAN:  Colleen Can. Deborah Chalk, M.D.DATE OF BIRTH:  [birth year]   DATE OF PROCEDURE:  11/09/2004  DATE OF DISCHARGE:                              CARDIAC CATHETERIZATION   HISTORY:  Ms. Gienger is a 77 year old female who presented with exacerbation  of chronic obstructive pulmonary disease  and asthmatic bronchitis.  She had  a slight elevation of cardiac enzymes and markers.  She is referred for  evaluation of coronary anatomy.   PROCEDURE:  Left heart catheterization with selective coronary angiography,  left ventricular angiography with Angio-Seal.   TYPE AND SITE OF ENTRY:  Percutaneous, right femoral artery.   CATHETERS:  6 French 4 curved Judkins right and left coronary catheters; 6  French pigtail ventriculographic catheter.   CONTRAST MATERIAL:  Omnipaque.   MEDICATIONS GIVEN PRIOR TO PROCEDURE:  Valium 10 mg p.o.   MEDICATIONS GIVEN DURING THE PROCEDURE:  Vancomycin 1 g IV.   COMMENTS:  The patient tolerated the procedure well.   HEMODYNAMIC DATA:  1.  The aortic pressure was 113/63.  2.  LV was 102/11-23.  3.  There was no aortic valve gradient noted on pullback.   ANGIOGRAPHIC DATA:  Left ventricular angiogram was performed in the RAO  position.  Overall cardiac size and silhouette were normal.  The global  ejection fraction was in the 60-70% range with normal regional wall motion.  There was no mitral regurgitation, intracardiac calcification, or  intracavitary filling defect.   CORONARY ARTERIES:  The coronary arteries arise and distribute normally.   1.  Right coronary artery:  The right coronary artery is a dominant vessel.      In the proximal one-third there is a somewhat discrete 50-60% narrowing.      It is a dominant vessel without distal stenosis.  2.  Left main  coronary artery is normal.  3.  Left anterior descending:  Left anterior descending had heavy      calcification in the proximal segments before the second diagonal      vessel.  Between the first large septal perforating branch and at the      level of the second diagonal vessel, there is somewhat diffuse and      calcified 60-70% narrowing.  There appears to be at least a 70%      narrowing just beyond the origin of the diagonal vessel.  This is      reviewed in multiple views and is felt to not be tighter than that at      this point in time.  The distal vessel is patent and of moderate size.      4.  Left circumflex:  Left circumflex has a 30-40% proximal narrowing.      It has multiple distal branches with no significant focal disease.   OVERALL IMPRESSION:  1.  Normal left ventricular function.  2.  Moderate coronary atherosclerosis with 50-60% proximal right coronary  artery and a 60-70% diffuse and heavily calcified left anterior      descending in the mid portion of the vessel with mild coronary      atherosclerosis in the left circumflex system.   DISCUSSION:  In light of these findings, it is felt that we would best treat  Ms. Monforte medically.      SNT/MEDQ  D:  11/09/2004  T:  11/09/2004  Job:  [id number]

## 2011-02-14 ENCOUNTER — Other Ambulatory Visit: Payer: Self-pay | Admitting: Pulmonary Disease

## 2011-03-11 ENCOUNTER — Other Ambulatory Visit: Payer: Self-pay | Admitting: Pulmonary Disease

## 2011-04-01 ENCOUNTER — Ambulatory Visit: Payer: Medicare Other | Admitting: Pulmonary Disease

## 2011-06-17 ENCOUNTER — Ambulatory Visit: Payer: Medicare Other | Admitting: Pulmonary Disease

## 2011-07-03 IMAGING — CT CT ANGIO CHEST
1 of 2 series · 19 of 32 positions shown · IV contrast (APPLIED)
Comparison: Plain films of the chest earlier this same day and
09/29/2010.CT abdomen and pelvis 01/31/2006.

CLINICAL DATA: Shortness of breath and decreased oxygen
saturation.  COPD.

CT ANGIOGRAPHY CHEST WITH CONTRAST
TECHNIQUE: Multidetector CT imaging of the chest was performed
using the standard protocol during bolus administration of
intravenous contrast.  Multiplanar CT image reconstructions
including MIPs were obtained to evaluate the vascular anatomy.
Contrast:  8 ml Kmnipaque-FFF.

[Series 5: pe thins @ 1mm · axial · 0.70mm/px · z∈[-259,+7]mm · 19 of 292 slices shown]
[im 13/292  lung]
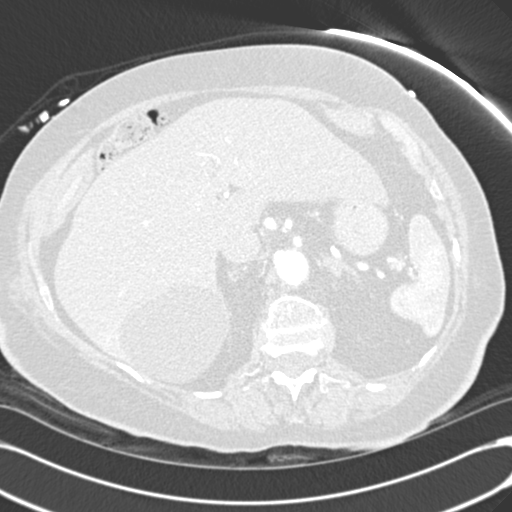
[im 26/292  soft-tissue]
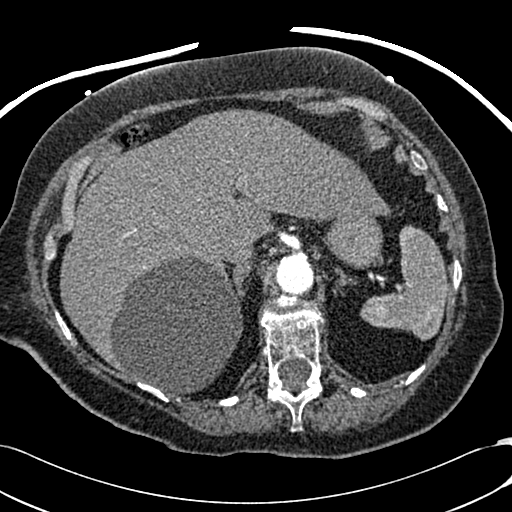
[im 38/292  lung]
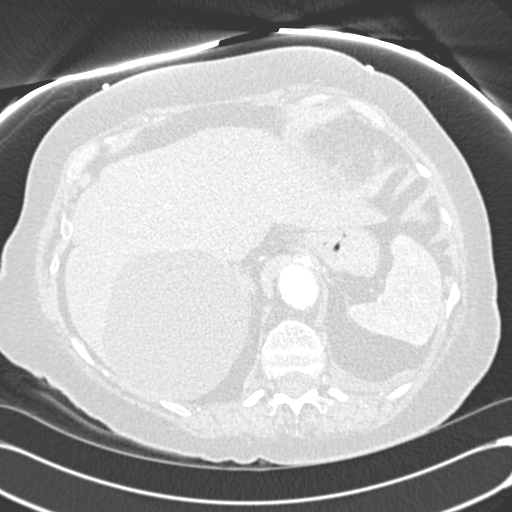
[im 64/292  soft-tissue]
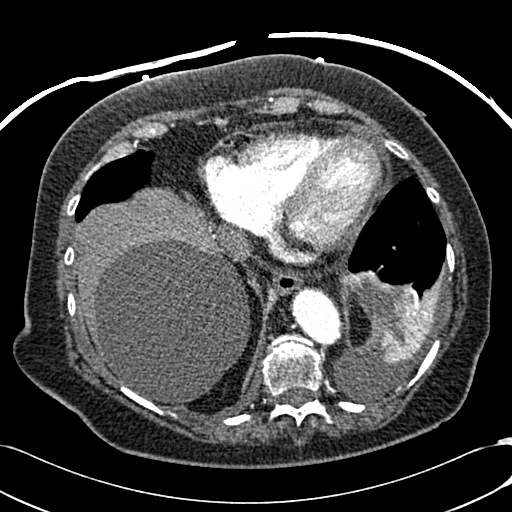
[im 76/292  lung]
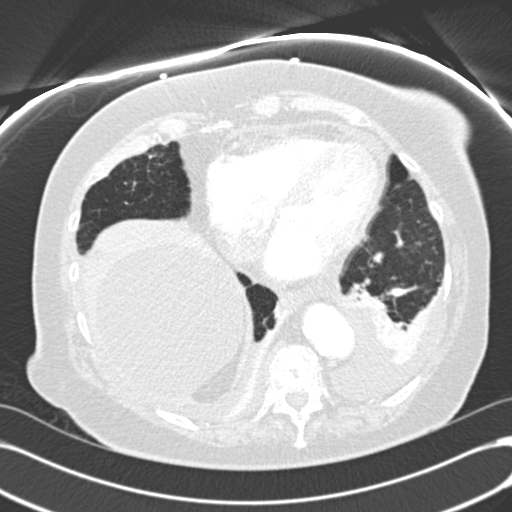
[im 89/292  soft-tissue]
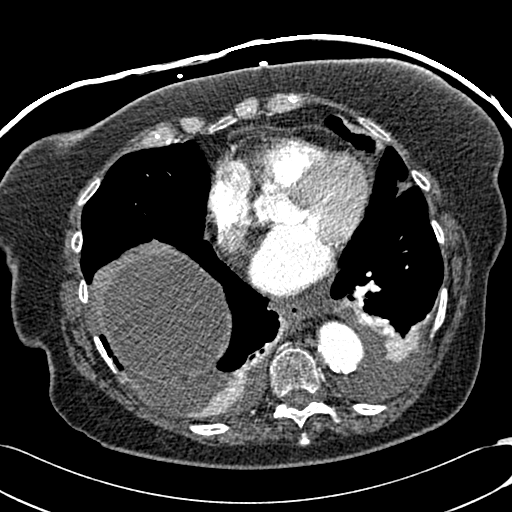
[im 102/292  lung]
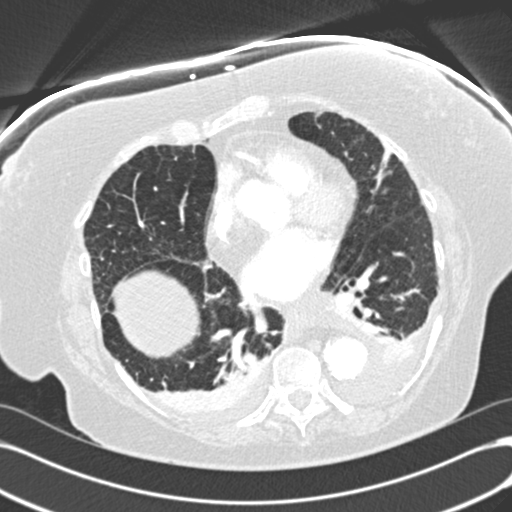
[im 114/292  soft-tissue]
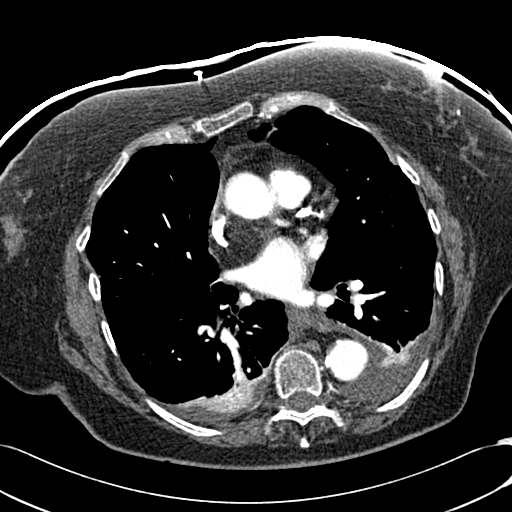
[im 127/292  lung]
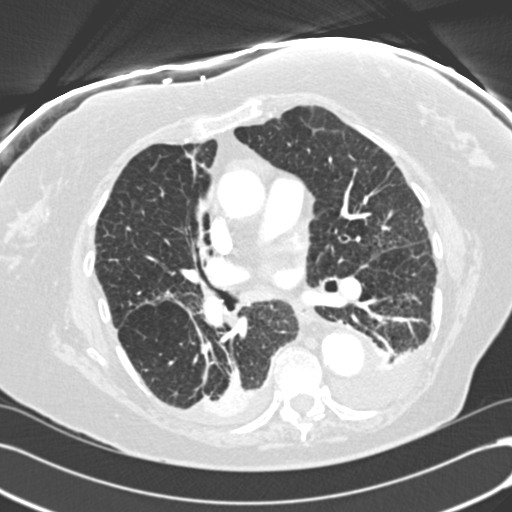
[im 152/292  soft-tissue]
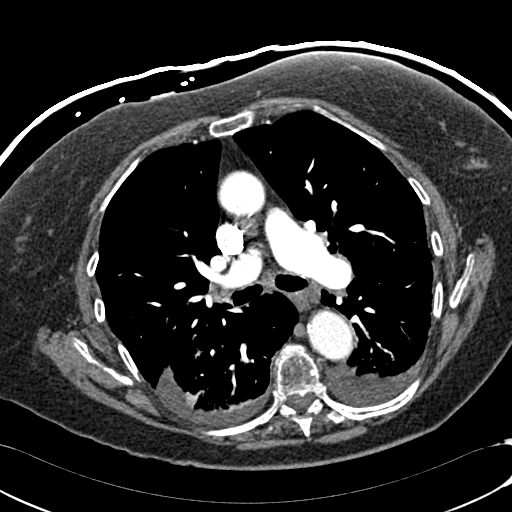
[im 165/292  lung]
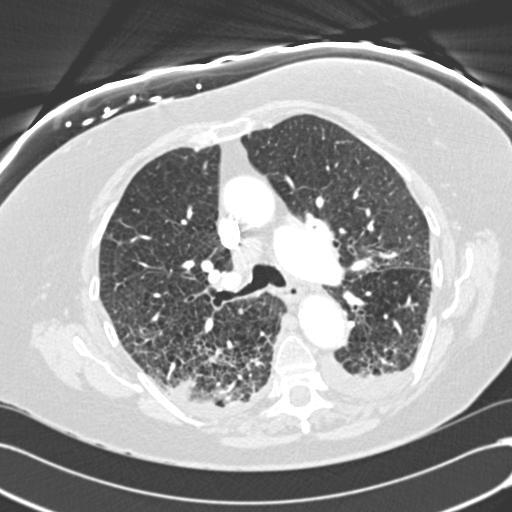
[im 178/292  soft-tissue]
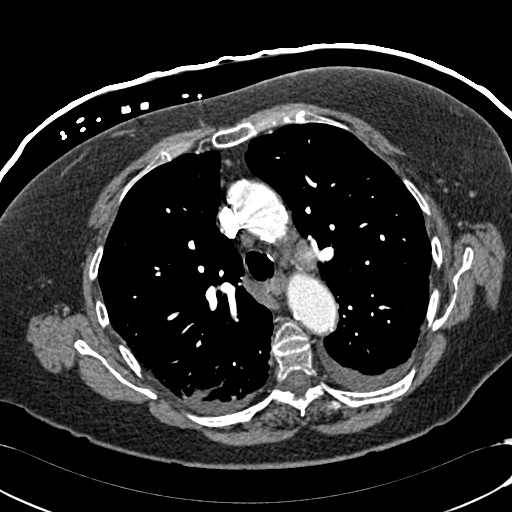
[im 190/292  lung]
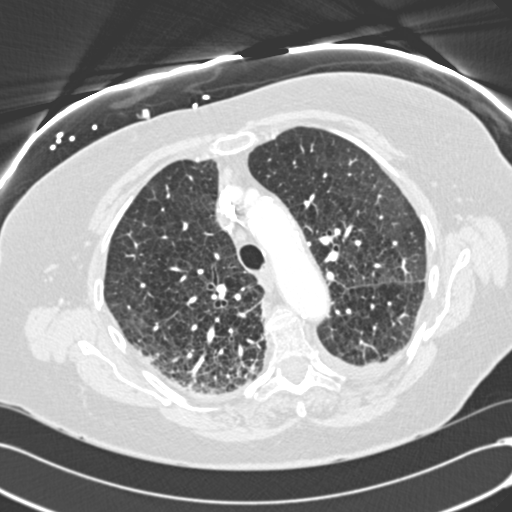
[im 203/292  soft-tissue]
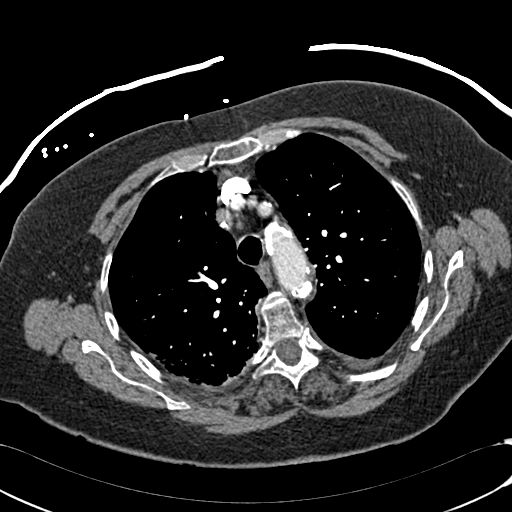
[im 216/292  lung]
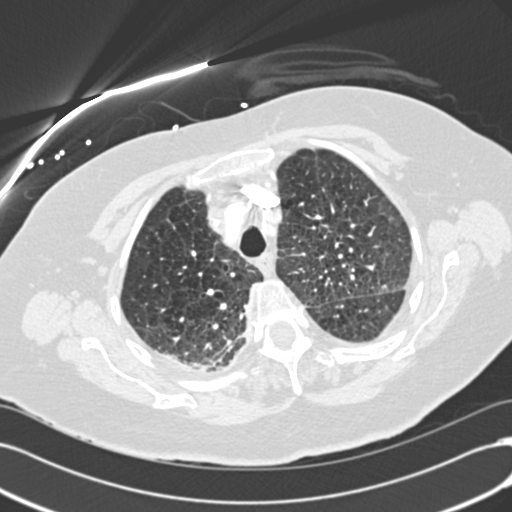
[im 228/292  soft-tissue]
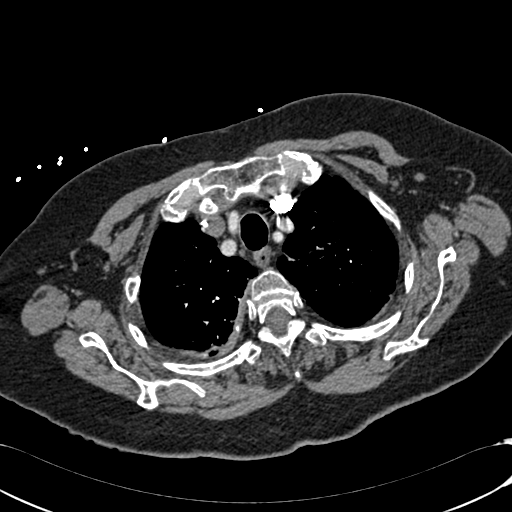
[im 254/292  lung]
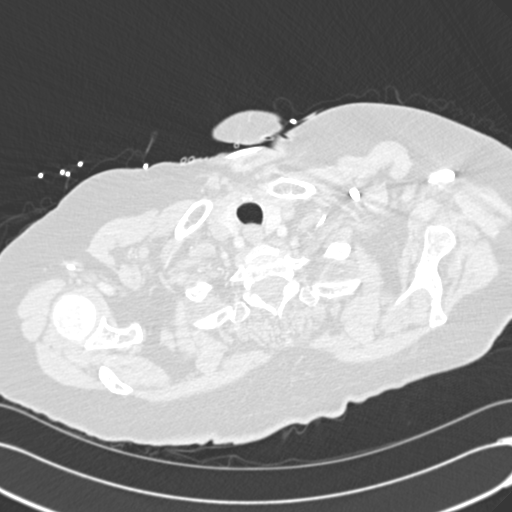
[im 266/292  soft-tissue]
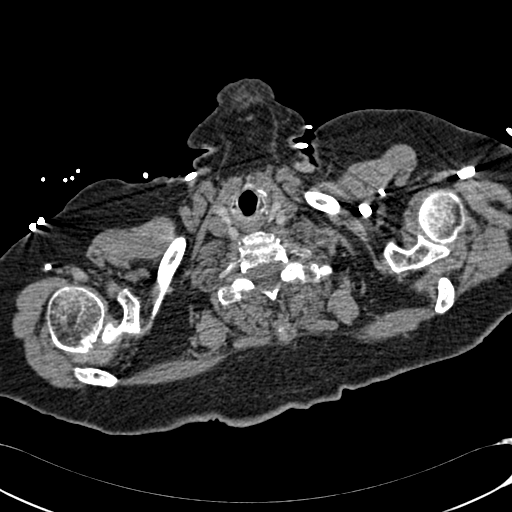
[im 279/292  lung]
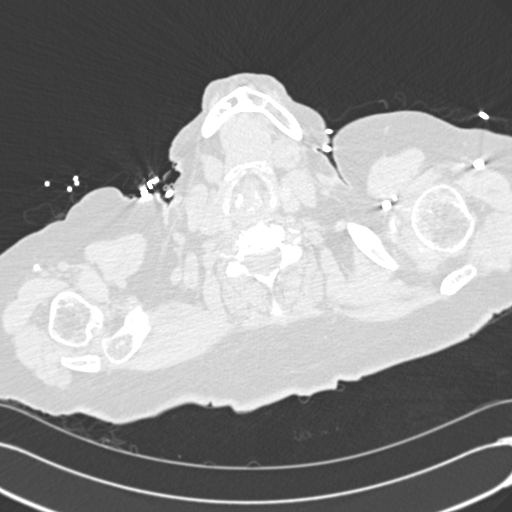

[19 of 32 positions shown; findings below may reference images not displayed]

FINDINGS: No pulmonary embolus is identified.  The patient has
small bilateral pleural effusions.  No pericardial effusion.  No
axillary, hilar or mediastinal lymphadenopathy.  Atherosclerotic
vascular disease is noted.

Lungs demonstrate extensive centrilobular emphysema.  Mild
compressive atelectasis is seen dependently.  The lungs are
otherwise unremarkable.

Incidentally imaged upper abdomen demonstrates a low attenuation
lesion in the posterior right hepatic lobe measuring 10.8 x 11.3 x
10.2 cm with Hounsfield unit measurements of approximately 3. The
lesion was present on the prior CT and measured 6.2 x 4.9 cm.
Visualized intra-abdominal contents are otherwise unremarkable.
Bones show compression fracture deformities of T7, T8, T11 and T12.
Fractures are present on comparison plain films.

Review of the MIP images confirms the above findings.
IMPRESSION: 1.  Negative for pulmonary embolus.
2.  Small bilateral pleural effusions.
3.  Extensive centrilobular emphysema.
4.  Enlarging cyst in the right hepatic lobe.
5.  Thoracic spine compression fractures most consistent with
senile osteoporotic injuries.

## 2011-07-29 ENCOUNTER — Ambulatory Visit: Payer: Medicare Other | Admitting: Pulmonary Disease

## 2011-08-19 ENCOUNTER — Ambulatory Visit: Payer: Medicare Other | Admitting: Pulmonary Disease

## 2011-08-25 ENCOUNTER — Other Ambulatory Visit: Payer: Self-pay | Admitting: Pulmonary Disease

## 2011-09-26 ENCOUNTER — Other Ambulatory Visit: Payer: Self-pay | Admitting: Pulmonary Disease

## 2011-10-03 ENCOUNTER — Encounter: Payer: Self-pay | Admitting: Pulmonary Disease

## 2011-10-04 ENCOUNTER — Encounter: Payer: Self-pay | Admitting: Pulmonary Disease

## 2011-10-04 ENCOUNTER — Ambulatory Visit (INDEPENDENT_AMBULATORY_CARE_PROVIDER_SITE_OTHER): Payer: Medicare Other | Admitting: Pulmonary Disease

## 2011-10-04 DIAGNOSIS — J449 Chronic obstructive pulmonary disease, unspecified: Secondary | ICD-10-CM

## 2011-10-04 DIAGNOSIS — J9610 Chronic respiratory failure, unspecified whether with hypoxia or hypercapnia: Secondary | ICD-10-CM

## 2011-10-04 DIAGNOSIS — J4489 Other specified chronic obstructive pulmonary disease: Secondary | ICD-10-CM

## 2011-10-04 NOTE — Assessment & Plan Note (Signed)
The patient has been stable since the last visit on her current bronchodilator regimen.  I have asked her to stay on her inhalers, and to try and improve her activity level if possible.  She is to followup with me in 6 months, but call if her condition worsens before then.

## 2011-10-04 NOTE — Patient Instructions (Signed)
Continue current meds Stay as active as possible Will send an order to advanced to evaluate you for pulsed oxygen followup with me in 6mos

## 2011-10-04 NOTE — Progress Notes (Signed)
  Subjective:    Patient ID: [name], female    DOB: 03-09-1927, 78 y.o.   MRN: [id number]  HPI The patient comes in today for followup of her known emphysema with chronic respiratory failure.  She's been compliant on her bronchodilator regimen, and feels that her breathing has remained at baseline.  She denies acute exacerbation or pulmonary infection since last visit.   Review of Systems  Constitutional: Negative for fever and unexpected weight change.  HENT: Negative for ear pain, nosebleeds, congestion, sore throat, rhinorrhea, sneezing, trouble swallowing, dental problem, postnasal drip and sinus pressure.   Eyes: Negative for redness and itching.  Respiratory: Negative for cough, chest tightness, shortness of breath and wheezing.   Cardiovascular: Positive for leg swelling. Negative for palpitations.  Gastrointestinal: Negative for nausea and vomiting.  Genitourinary: Negative for dysuria.  Musculoskeletal: Positive for joint swelling.  Skin: Negative for rash.  Neurological: Negative for headaches.  Hematological: Bruises/bleeds easily.  Psychiatric/Behavioral: Negative for dysphoric mood. The patient is not nervous/anxious.        Objective:   Physical Exam Overweight female in no acute distress Nose without purulence or discharge noted Chest with decreased breath sounds throughout, a few basilar crackles, no wheezes or rhonchi Cardiac exam is regular rate and rhythm Lower extremities with mild edema, no cyanosis Alert and oriented, moves all 4 extremities.       Assessment & Plan:

## 2011-11-01 ENCOUNTER — Other Ambulatory Visit: Payer: Self-pay | Admitting: Pulmonary Disease

## 2011-11-07 ENCOUNTER — Emergency Department (HOSPITAL_COMMUNITY): Payer: Medicare Other

## 2011-11-07 ENCOUNTER — Emergency Department (HOSPITAL_COMMUNITY)
Admission: EM | Admit: 2011-11-07 | Discharge: 2011-11-07 | Disposition: A | Discharge status: Home / Self Care | Payer: Medicare Other | Attending: Emergency Medicine | Admitting: Emergency Medicine

## 2011-11-07 ENCOUNTER — Other Ambulatory Visit: Payer: Self-pay

## 2011-11-07 ENCOUNTER — Encounter (HOSPITAL_COMMUNITY): Payer: Self-pay | Admitting: *Deleted

## 2011-11-07 DIAGNOSIS — R109 Unspecified abdominal pain: Secondary | ICD-10-CM | POA: Insufficient documentation

## 2011-11-07 DIAGNOSIS — Z79899 Other long term (current) drug therapy: Secondary | ICD-10-CM | POA: Insufficient documentation

## 2011-11-07 DIAGNOSIS — I10 Essential (primary) hypertension: Secondary | ICD-10-CM | POA: Insufficient documentation

## 2011-11-07 DIAGNOSIS — R112 Nausea with vomiting, unspecified: Secondary | ICD-10-CM | POA: Insufficient documentation

## 2011-11-07 DIAGNOSIS — I252 Old myocardial infarction: Secondary | ICD-10-CM | POA: Insufficient documentation

## 2011-11-07 DIAGNOSIS — J4489 Other specified chronic obstructive pulmonary disease: Secondary | ICD-10-CM | POA: Insufficient documentation

## 2011-11-07 DIAGNOSIS — R531 Weakness: Secondary | ICD-10-CM

## 2011-11-07 DIAGNOSIS — J449 Chronic obstructive pulmonary disease, unspecified: Secondary | ICD-10-CM | POA: Insufficient documentation

## 2011-11-07 DIAGNOSIS — R5381 Other malaise: Secondary | ICD-10-CM | POA: Insufficient documentation

## 2011-11-07 DIAGNOSIS — Z8673 Personal history of transient ischemic attack (TIA), and cerebral infarction without residual deficits: Secondary | ICD-10-CM | POA: Insufficient documentation

## 2011-11-07 DIAGNOSIS — R0602 Shortness of breath: Secondary | ICD-10-CM | POA: Insufficient documentation

## 2011-11-07 DIAGNOSIS — R197 Diarrhea, unspecified: Secondary | ICD-10-CM | POA: Insufficient documentation

## 2011-11-07 HISTORY — DX: Chronic obstructive pulmonary disease, unspecified: J44.9

## 2011-11-07 LAB — URINALYSIS, ROUTINE W REFLEX MICROSCOPIC
Glucose, UA: NEGATIVE mg/dL
Leukocytes, UA: NEGATIVE
Nitrite: NEGATIVE
Protein, ur: NEGATIVE mg/dL
Urobilinogen, UA: 0.2 mg/dL (ref 0.0–1.0)

## 2011-11-07 LAB — DIFFERENTIAL
Eosinophils Absolute: 0.1 10*3/uL (ref 0.0–0.7)
Monocytes Absolute: 0.8 10*3/uL (ref 0.1–1.0)
Neutrophils Relative %: 88 % — ABNORMAL HIGH (ref 43–77)
WBC Morphology: INCREASED

## 2011-11-07 LAB — COMPREHENSIVE METABOLIC PANEL
AST: 20 U/L (ref 0–37)
Albumin: 3.4 g/dL — ABNORMAL LOW (ref 3.5–5.2)
BUN: 22 mg/dL (ref 6–23)
CO2: 28 mEq/L (ref 19–32)
Calcium: 9.4 mg/dL (ref 8.4–10.5)
Creatinine, Ser: 0.72 mg/dL (ref 0.50–1.10)
GFR calc non Af Amer: 78 mL/min — ABNORMAL LOW (ref >90)

## 2011-11-07 LAB — CBC
MCH: 30.3 pg (ref 26.0–34.0)
MCHC: 33.8 g/dL (ref 30.0–36.0)
Platelets: 266 10*3/uL (ref 150–400)

## 2011-11-07 LAB — LIPASE, BLOOD: Lipase: 18 U/L (ref 11–59)

## 2011-11-07 LAB — LACTIC ACID, PLASMA: Lactic Acid, Venous: 1.6 mmol/L (ref 0.5–2.2)

## 2011-11-07 MED ORDER — SODIUM CHLORIDE 0.9 % IV BOLUS (SEPSIS)
500.0000 mL | Freq: Once | INTRAVENOUS | Status: AC
  Administered 2011-11-07: 500 mL via INTRAVENOUS

## 2011-11-07 NOTE — ED Notes (Signed)
Stood pt up beside bed with assistance. HR increased by 10-15 & c/o feeling very weak. Denied dizziness. Dr. Alto Denver informed & aware. Pt requesting something eat, given Happy meal & drink

## 2011-11-07 NOTE — ED Notes (Signed)
Reports onset 0700, denies abd pain. States was unable to make it to bathroom on time.

## 2011-11-07 NOTE — ED Notes (Signed)
C/o n/v/d onset 0700. Denies abd pain. Pt found sitting in chair at home incontinent of stool. Reports ate a lot of peanuts yesterday  & that they give her diarrhea

## 2011-11-07 NOTE — Discharge Instructions (Signed)
Near-Syncope Near-syncope is sudden weakness, dizziness, or feeling like you might pass out (faint). This may occur when getting up after sitting or while standing for a long period of time. Near-syncope can be caused by a drop in blood pressure. This is a common reaction, but it may occur to a greater degree in people taking medicines to control their blood pressure. Fainting often occurs when the blood pressure or pulse is too low to provide enough blood flow to the brain to keep you conscious. Fainting and near-syncope are not usually due to serious medical problems. However, certain people should be more cautious in the event of near-syncope, including elderly patients, patients with diabetes, and patients with a history of heart conditions (especially irregular rhythms).  CAUSES   Drop in blood pressure.   Physical pain.   Dehydration.   Heat exhaustion.   Emotional distress.   Low blood sugar.   Internal bleeding.   Heart and circulatory problems.   Infections.  SYMPTOMS   Dizziness.   Feeling sick to your stomach (nauseous).   Nearly fainting.   Body numbness.   Turning pale.   Tunnel vision.   Weakness.  HOME CARE INSTRUCTIONS   Lie down right away if you start feeling like you might faint. Breathe deeply and steadily. Wait until all the symptoms have passed. Most of these episodes last only a few minutes. You may feel tired for several hours.   Drink enough fluids to keep your urine clear or pale yellow.   If you are taking blood pressure or heart medicine, get up slowly, taking several minutes to sit and then stand. This can reduce dizziness that is caused by a drop in blood pressure.  SEEK IMMEDIATE MEDICAL CARE IF:   You have a severe headache.   Unusual pain develops in the chest, abdomen, or back.   There is bleeding from the mouth or rectum, or you have black or tarry stool.   An irregular heartbeat or a very rapid pulse develops.   You have  repeated fainting or seizure-like jerking during an episode.   You faint when sitting or lying down.   You develop confusion.   You have difficulty walking.   Severe weakness develops.   Vision problems develop.  MAKE SURE YOU:   Understand these instructions.   Will watch your condition.   Will get help right away if you are not doing well or get worse.  Document Released: 09/05/2005 Document Revised: 05/18/2011 Document Reviewed: 10/22/2010 [facility] Patient Information 2012 [place], [place].Weakness Weakness can be caused by many things. Your doctor has checked for the most common causes. HOME CARE  Rest.   Eat a well-balanced diet.   Exercise every day.   Go to follow-up appointments.  GET HELP RIGHT AWAY IF:   There is chest pain or chest pressure.   You have problems breathing.   You cannot do usual daily activities.   You have problems speaking or swallowing.   You have a very bad headache or belly (abdominal) pain.   The heartbeat does not feel normal or the pulse is very fast.   You are confused.   You have vision problems or problems walking.   You have chills.   You or your child has a temperature by mouth above 102 F (38.9 C), not controlled by medicine.   Your baby is older than 3 months with a rectal temperature of 102 F (38.9 C) or higher.   Your baby is 3  months old or younger with a rectal temperature of 100.4 F (38 C) or higher.  MAKE SURE YOU:   Understand these instructions.   Will watch this condition.   Will get help right away if you are not doing well or get worse.  Document Released: 08/18/2008 Document Revised: 05/18/2011 Document Reviewed: 08/18/2008 [facility] Patient Information 2012 [place], [place].

## 2011-11-07 NOTE — ED Notes (Signed)
Informed patient and/or family of status. No voiced complaints presently. NAD. Resting, eyes closed, easily aroused.

## 2011-11-07 NOTE — ED Notes (Signed)
Patient transported to X-ray 

## 2011-11-07 NOTE — ED Provider Notes (Signed)
History     CSN: [id number]  Arrival date & time 11/07/11  1030   First MD Initiated Contact with Patient 11/07/11 1030      Chief Complaint  Patient presents with  . Emesis  . Diarrhea    (Consider location/radiation/quality/duration/timing/severity/associated sxs/prior treatment) HPI Patient is an 78 yo female who presents with copious amounts of diarrhea that began acutely overnight.  Patient has no known sick contacts.  She denies fever.  Patient reports that she ate a lot of peanuts yesterday and sometimes has this reaction afterwards.  It began with nausea and vomiting and progressed to diarrhea.  Stool is neither black and tarry nor bloody.  She denies any abdominal pain at all.  She did have some chest pain with emesis PTA but currently denies nausea.  There are no other associated or modifying factors. . Past Medical History  Diagnosis Date  . Allergic rhinitis   . TIA (transient ischemic attack)     x2  . Hypertension   . Myocardial infarction   . COPD (chronic obstructive pulmonary disease)     Past Surgical History  Procedure Date  . Multiple orthopedic procedures     Family History  Problem Relation Age of Onset  . Allergies Sister   . Heart disease Mother   . Heart disease Father   . Prostate cancer Father   . Colon cancer Sister   . Bone cancer Sister     History  Substance Use Topics  . Smoking status: Former Smoker -- 1.5 packs/day for 20 years    Types: Cigarettes    Quit date: 10/20/2004  . Smokeless tobacco: Not on file  . Alcohol Use: No    OB History    Grav Para Term Preterm Abortions TAB SAB Ect Mult Living                  Review of Systems  Constitutional: Negative.   HENT: Negative.   Eyes: Negative.   Respiratory: Negative.   Cardiovascular: Negative.   Gastrointestinal: Positive for nausea, vomiting, abdominal pain and diarrhea.  Genitourinary: Negative.   Musculoskeletal: Negative.   Skin: Negative.   Neurological:  Negative.   Hematological: Negative.   Psychiatric/Behavioral: Negative.   All other systems reviewed and are negative.    Allergies  Codeine; Iohexol; Risedronate sodium; and Sulfonamide derivatives  Home Medications   Current Outpatient Rx  Name Route Sig Dispense Refill  . ALBUTEROL SULFATE HFA 108 (90 BASE) MCG/ACT IN AERS Inhalation Inhale 2 puffs into the lungs every 6 (six) hours as needed. For wheezing    . ASPIRIN EC 81 MG PO TBEC Oral Take 81 mg by mouth daily.    . BUDESONIDE-FORMOTEROL FUMARATE 160-4.5 MCG/ACT IN AERO Inhalation Inhale 2 puffs into the lungs 2 (two) times daily.    Marland Kitchen CALTRATE 600 PO Oral Take 1 tablet by mouth daily.    Marland Kitchen CALCIUM CARBONATE ANTACID 500 MG PO CHEW Oral Chew 1 tablet by mouth daily as needed. For indigestion    . CLOPIDOGREL BISULFATE 75 MG PO TABS Oral Take 75 mg by mouth every other day.    . CYANOCOBALAMIN 500 MCG PO TABS Oral Take 500 mcg by mouth daily.    . ENALAPRIL MALEATE 5 MG PO TABS Oral Take 5 mg by mouth 2 (two) times daily.    Marland Kitchen EZETIMIBE-SIMVASTATIN 10-40 MG PO TABS Oral Take 1 tablet by mouth at bedtime.     . OMEGA-3 FATTY ACIDS 1000 MG PO  CAPS Oral Take 1 capsule by mouth daily.    . FUROSEMIDE 20 MG PO TABS Oral Take 20 mg by mouth daily.    Marland Kitchen GABAPENTIN 300 MG PO CAPS Oral Take 300 mg by mouth 2 (two) times daily.     . ISOSORBIDE MONONITRATE ER 30 MG PO TB24 Oral Take 30 mg by mouth daily.    Marland Kitchen METOPROLOL TARTRATE 50 MG PO TABS Oral Take 50 mg by mouth daily.    Marland Kitchen POTASSIUM CHLORIDE CRYS ER 20 MEQ PO TBCR Oral Take 20 mEq by mouth daily as needed. For potassium level    . TIOTROPIUM BROMIDE MONOHYDRATE 18 MCG IN CAPS Inhalation Place 18 mcg into inhaler and inhale daily.    . VENLAFAXINE HCL ER 75 MG PO CP24 Oral Take 75 mg by mouth 2 (two) times daily.       BP 118/48  Pulse 97  Temp(Src) 97.5 F (36.4 C) (Oral)  Resp 18  SpO2 96%  Physical Exam  Nursing note and vitals reviewed. GEN: Well-developed,  well-nourished female in mild distress HEENT: Atraumatic, normocephalic. Oropharynx clear without erythema EYES: PERRLA BL, no scleral icterus. NECK: Trachea midline, no meningismus CV: regular rate and rhythm. No murmurs, rubs, or gallops PULM: No respiratory distress.  No crackles, wheezes, or rales. GI: soft, non-tender. No guarding, rebound, or tenderness. + bowel sounds  Neuro: cranial nerves 2-12 intact, no abnormalities of strength or sensation, A and O x 3 MSK: Patient moves all 4 extremities symmetrically, no deformity, edema, or injury noted Psych: no abnormality of mood   ED Course  Procedures (including critical care time)   Date: 11/07/2011  Rate: 102  Rhythm: sinus tachycardia and premature ventricular contractions (PVC)  QRS Axis: normal  Intervals: normal  ST/T Wave abnormalities: nonspecific T wave changes  Conduction Disutrbances:none  Narrative Interpretation:   Old EKG Reviewed: unchanged   Labs Reviewed  CBC - Abnormal; Notable for the following:    WBC 12.8 (*)    All other components within normal limits  DIFFERENTIAL - Abnormal; Notable for the following:    Neutrophils Relative 88 (*)    Lymphocytes Relative 5 (*)    Neutro Abs 11.3 (*)    Lymphs Abs 0.6 (*)    All other components within normal limits  COMPREHENSIVE METABOLIC PANEL - Abnormal; Notable for the following:    Glucose, Bld 120 (*)    Albumin 3.4 (*)    GFR calc non Af Amer 78 (*)    All other components within normal limits  URINALYSIS, ROUTINE W REFLEX MICROSCOPIC - Abnormal; Notable for the following:    APPearance HAZY (*)    All other components within normal limits  LIPASE, BLOOD  LACTIC ACID, PLASMA  POCT I-STAT TROPONIN I   Dg Chest 2 View  11/07/2011  *RADIOLOGY REPORT*  Clinical Data: Shortness of breath.  Abdominal pain.  Nausea and vomiting.  Diarrhea.  CHEST - 2 VIEW 11/07/2011:  Comparison: Portable chest x-ray 10/04/2010 dating back to 09/27/2010, and two-view chest  x-ray 09/24/2010 [facility].  Findings: Suboptimal inspiration due to body habitus which accounts for crowded bronchovascular markings at the bases and accentuates the cardiac silhouette.  This accounts for linear atelectasis in the left lower lobe.  Stable linear scarring in the right middle lobe.  Lungs otherwise clear.  Cardiac silhouette mildly enlarged but stable.  Thoracic aorta tortuous and atherosclerotic, unchanged.  Hilar and mediastinal contours otherwise unremarkable. No pleural effusions.  Multiple thoracic compression  fractures due to osteopenia, accounting for kyphous deformity.  IMPRESSION: Suboptimal inspiration accounts for atelectasis at the left lower lobe.  Stable right middle lobe scarring.  No acute cardiopulmonary disease otherwise.  Stable mild cardiomegaly without pulmonary edema.  Original Report Authenticated By: Arnell Sieving, M.D.     1. Diarrhea   2. Weakness       MDM  Patient was evaluated by myself.  She arrived with significant diarrhea.  This was non-bloody.  Patient had resolution shortly after arrival and remained symptom free. Given her presentation and age EKG and TNI were performed and were negative.  CXR, UA, CMET, and lipase provided no obvious explanation.  The patient did have a very mild leukocytosis.  She was treated with IVF.  Patient was maintained on her home O2 requirement.  Following rehydration patient felt much better and was able to tolerate po intake.  She was able to stand and felt back to baseline.  Her grandson was at bedside and agreed with plan to discharge home.  Patient was discharged home in good condition.        Cyndra Numbers, MD 11/07/11 [phone]

## 2011-11-07 NOTE — ED Notes (Signed)
Tolerated diet & fluids well

## 2012-02-15 ENCOUNTER — Other Ambulatory Visit: Payer: Self-pay | Admitting: Pulmonary Disease

## 2012-03-07 ENCOUNTER — Telehealth: Payer: Self-pay | Admitting: Nurse Practitioner

## 2012-03-07 NOTE — Telephone Encounter (Signed)
Patient request return call from Dawayne Patricia. At [phone] concerning referral.

## 2012-03-07 NOTE — Telephone Encounter (Signed)
I returned call and husband Nitya Cauthon of birth [birth year]) answered phone and stated call was about him. Phone note started for [employer].

## 2012-04-06 ENCOUNTER — Ambulatory Visit: Payer: Medicare Other | Admitting: Pulmonary Disease

## 2012-05-09 ENCOUNTER — Ambulatory Visit: Payer: Medicare Other | Admitting: Pulmonary Disease

## 2012-05-30 ENCOUNTER — Ambulatory Visit: Payer: Medicare Other | Admitting: Pulmonary Disease

## 2012-06-09 ENCOUNTER — Other Ambulatory Visit: Payer: Self-pay | Admitting: Pulmonary Disease

## 2012-06-22 ENCOUNTER — Ambulatory Visit: Payer: Medicare Other | Admitting: Pulmonary Disease

## 2012-06-26 ENCOUNTER — Ambulatory Visit: Payer: Medicare Other | Admitting: Pulmonary Disease

## 2012-07-25 ENCOUNTER — Ambulatory Visit: Payer: Medicare Other | Admitting: Pulmonary Disease

## 2012-07-25 ENCOUNTER — Encounter: Payer: Self-pay | Admitting: Pulmonary Disease

## 2012-07-25 ENCOUNTER — Ambulatory Visit (INDEPENDENT_AMBULATORY_CARE_PROVIDER_SITE_OTHER): Payer: Medicare Other | Admitting: Pulmonary Disease

## 2012-07-25 VITALS — BP 148/80 | HR 102 | Temp 97.8°F | Ht 65.0 in | Wt 159.8 lb

## 2012-07-25 DIAGNOSIS — J449 Chronic obstructive pulmonary disease, unspecified: Secondary | ICD-10-CM

## 2012-07-25 NOTE — Assessment & Plan Note (Signed)
The patient feels that she is very stable from a pulmonary standpoint.  Her dyspnea on exertion is at baseline, and she denies having a recent pulmonary infection or acute exacerbation.  I've asked her to continue on her current medications, and to try and get some type of exercise as well as working on weight loss.

## 2012-07-25 NOTE — Progress Notes (Signed)
  Subjective:    Patient ID: [name], female    DOB: [birth year], 78 y.o.   MRN: [id number]  HPI The patient comes in today for followup of her known COPD.  She has been compliant with her bronchodilator regimen, and feels that she is maintaining her usual baseline.  She is denying any recent acute exacerbation or pulmonary infection.   Review of Systems  Constitutional: Negative for fever and unexpected weight change.  HENT: Positive for rhinorrhea. Negative for ear pain, nosebleeds, congestion, sore throat, sneezing, trouble swallowing, dental problem, postnasal drip and sinus pressure.   Eyes: Negative for redness and itching.  Respiratory: Positive for shortness of breath and wheezing. Negative for cough and chest tightness.   Cardiovascular: Positive for leg swelling. Negative for palpitations.  Gastrointestinal: Negative for nausea and vomiting.  Genitourinary: Negative for dysuria.  Musculoskeletal: Positive for joint swelling.  Skin: Negative for rash.  Neurological: Negative for headaches.  Hematological: Bruises/bleeds easily.  Psychiatric/Behavioral: Negative for dysphoric mood. The patient is not nervous/anxious.        Objective:   Physical Exam Overweight female in no acute distress Nose without purulence or discharge noted Chest with decreased breath sounds, a few basilar crackles, no wheezes or rhonchi Cardiac exam with regular rate and rhythm Lower extremities with 1+ ankle edema, no cyanosis Alert and oriented, moves all 4 extremities.       Assessment & Plan:

## 2012-07-25 NOTE — Patient Instructions (Addendum)
No change in medications Try and stay as active as possible followup with me in 6mos.  

## 2012-08-02 ENCOUNTER — Other Ambulatory Visit (HOSPITAL_COMMUNITY): Payer: Self-pay | Admitting: Nurse Practitioner

## 2012-08-02 DIAGNOSIS — M79609 Pain in unspecified limb: Secondary | ICD-10-CM

## 2012-08-02 DIAGNOSIS — R0989 Other specified symptoms and signs involving the circulatory and respiratory systems: Secondary | ICD-10-CM

## 2012-08-08 ENCOUNTER — Inpatient Hospital Stay (HOSPITAL_COMMUNITY): Admission: RE | Admit: 2012-08-08 | Payer: Medicare Other | Source: Ambulatory Visit

## 2012-11-05 ENCOUNTER — Other Ambulatory Visit: Payer: Self-pay | Admitting: Pulmonary Disease

## 2012-11-07 ENCOUNTER — Other Ambulatory Visit: Payer: Self-pay | Admitting: Pulmonary Disease

## 2013-01-23 ENCOUNTER — Ambulatory Visit: Payer: Medicare Other | Admitting: Pulmonary Disease

## 2013-02-06 ENCOUNTER — Ambulatory Visit: Payer: Self-pay | Admitting: Pulmonary Disease

## 2013-02-12 ENCOUNTER — Other Ambulatory Visit: Payer: Self-pay | Admitting: Pulmonary Disease

## 2013-02-27 ENCOUNTER — Ambulatory Visit: Payer: Self-pay | Admitting: Pulmonary Disease

## 2013-03-20 ENCOUNTER — Ambulatory Visit: Payer: Self-pay | Admitting: Pulmonary Disease

## 2013-04-10 ENCOUNTER — Ambulatory Visit (INDEPENDENT_AMBULATORY_CARE_PROVIDER_SITE_OTHER): Payer: Medicare Other | Admitting: Pulmonary Disease

## 2013-04-10 ENCOUNTER — Encounter: Payer: Self-pay | Admitting: Pulmonary Disease

## 2013-04-10 VITALS — BP 142/72 | HR 84 | Temp 97.3°F | Ht <= 58 in | Wt 165.4 lb

## 2013-04-10 DIAGNOSIS — J449 Chronic obstructive pulmonary disease, unspecified: Secondary | ICD-10-CM

## 2013-04-10 NOTE — Patient Instructions (Addendum)
No change in medications. Stay on the oxygen followup with me again in 6mos.

## 2013-04-10 NOTE — Progress Notes (Signed)
  Subjective:    Patient ID: [name], female    DOB: 1927-07-24, 79 y.o.   MRN: [id number]  HPI The patient comes in today for followup of her known COPD with chronic respiratory failure.  She has been stable since last visit, with no acute exacerbation or pulmonary infection.  She has both good and bad days, but the most part stays more in the good range.  She denies any significant cough or mucus production.  She has been compliant on her oxygen and bronchodilator regimen.   Review of Systems  Constitutional: Negative for fever and unexpected weight change.  HENT: Negative for ear pain, nosebleeds, congestion, sore throat, rhinorrhea, sneezing, trouble swallowing, dental problem, postnasal drip and sinus pressure.   Eyes: Negative for redness and itching.  Respiratory: Positive for shortness of breath. Negative for cough, chest tightness and wheezing.   Cardiovascular: Negative for palpitations and leg swelling.  Gastrointestinal: Negative for nausea and vomiting.  Genitourinary: Negative for dysuria.  Musculoskeletal: Negative for joint swelling.  Skin: Negative for rash.  Neurological: Negative for headaches.  Hematological: Does not bruise/bleed easily.  Psychiatric/Behavioral: Negative for dysphoric mood. The patient is not nervous/anxious.        Objective:   Physical Exam Overweight female in no acute distress Nose without purulent discharge noted Neck without lymphadenopathy or thyromegaly Chest with decreased breath sounds throughout, no wheezing or rhonchi.  Significant kyphosis noted Cardiac exam with regular rate and rhythm but distant heart sounds Lower extremities with 2+ edema, no cyanosis Alert and oriented, moves all 4 extremities.       Assessment & Plan:

## 2013-04-10 NOTE — Assessment & Plan Note (Signed)
The patient has done fairly well since the last visit, and is maintaining on her bronchodilator regimen and her oxygen.  She has not had an acute exacerbation, and feels that her dyspnea on exertion is staying on her usual range.  I have asked her to stay on her current medications, and try and stay as active as possible.  I will see her back in 6 months.

## 2013-06-23 ENCOUNTER — Other Ambulatory Visit: Payer: Self-pay | Admitting: Pulmonary Disease

## 2013-10-16 ENCOUNTER — Ambulatory Visit (INDEPENDENT_AMBULATORY_CARE_PROVIDER_SITE_OTHER)
Admission: RE | Admit: 2013-10-16 | Discharge: 2013-10-16 | Disposition: A | Discharge status: Home / Self Care | Payer: Medicare Other | Source: Ambulatory Visit | Attending: Pulmonary Disease | Admitting: Pulmonary Disease

## 2013-10-16 ENCOUNTER — Ambulatory Visit (INDEPENDENT_AMBULATORY_CARE_PROVIDER_SITE_OTHER): Payer: Medicare Other | Admitting: Pulmonary Disease

## 2013-10-16 ENCOUNTER — Encounter: Payer: Self-pay | Admitting: Pulmonary Disease

## 2013-10-16 VITALS — BP 112/82 | HR 79 | Temp 97.4°F | Ht 60.0 in | Wt 172.0 lb

## 2013-10-16 DIAGNOSIS — J439 Emphysema, unspecified: Secondary | ICD-10-CM

## 2013-10-16 DIAGNOSIS — J438 Other emphysema: Secondary | ICD-10-CM

## 2013-10-16 DIAGNOSIS — J9610 Chronic respiratory failure, unspecified whether with hypoxia or hypercapnia: Secondary | ICD-10-CM

## 2013-10-16 NOTE — Assessment & Plan Note (Signed)
The patient overall has been stable from a COPD standpoint. She has not had a recent acute exacerbation, but has had some decline in her functionality probably secondary to weight gain and deconditioning. I have stressed to her the importance of an exercise and conditioning program, and have recommended referral to pulmonary rehabilitation. She is definitely interested if we can do this through [facility]. I have asked her to continue her current medications, as well as oxygen.

## 2013-10-16 NOTE — Patient Instructions (Signed)
Will check chest xray today, and call you with results. Will refer to pulmonary rehab program at [facility].  Let me know if you do not hear from them in a few weeks. No change in medication. followup with me again in 20mos.

## 2013-10-16 NOTE — Progress Notes (Signed)
   Subjective:    Patient ID: [name], female    DOB: 1927-02-24, 80 y.o.   MRN: [id number]  HPI The patient comes in today for followup of her known COPD with chronic respiratory failure. She has been wearing her oxygen compliantly, and staying on her bronchodilator regimen. She has not had an acute exacerbation since last visit, but has had some decline in her functional status and quality of life. She is not doing any type of activity, and clearly feels that she is deconditioned. She is willing to attend pulmonary rehabilitation.   Review of Systems  Constitutional: Negative for fever and unexpected weight change.  HENT: Negative for congestion, dental problem, ear pain, nosebleeds, postnasal drip, rhinorrhea, sinus pressure, sneezing, sore throat and trouble swallowing.   Eyes: Negative for redness and itching.  Respiratory: Positive for shortness of breath. Negative for cough, chest tightness and wheezing.   Cardiovascular: Negative for palpitations and leg swelling.  Gastrointestinal: Negative for nausea and vomiting.  Genitourinary: Negative for dysuria.  Musculoskeletal: Negative for joint swelling.  Skin: Negative for rash.  Neurological: Negative for headaches.  Hematological: Does not bruise/bleed easily.  Psychiatric/Behavioral: Negative for dysphoric mood. The patient is not nervous/anxious.        Objective:   Physical Exam Well-developed female in no acute distress Nose without purulence or discharge noted Neck without lymphadenopathy or thyromegaly Chest with mildly decreased breath sounds, no significant wheezes or crackles Cardiac exam with regular rate and rhythm, 2/6 systolic murmur Lower extremities with mild ankle edema, no cyanosis Alert and oriented, moves all 4 extremities.       Assessment & Plan:

## 2014-04-16 ENCOUNTER — Ambulatory Visit: Payer: Medicare Other | Admitting: Pulmonary Disease

## 2014-04-23 ENCOUNTER — Encounter: Payer: Self-pay | Admitting: Pulmonary Disease

## 2014-04-23 ENCOUNTER — Ambulatory Visit (INDEPENDENT_AMBULATORY_CARE_PROVIDER_SITE_OTHER): Payer: Medicare Other | Admitting: Pulmonary Disease

## 2014-04-23 VITALS — BP 122/70 | HR 78 | Temp 98.0°F | Ht 60.0 in | Wt 169.4 lb

## 2014-04-23 DIAGNOSIS — J438 Other emphysema: Secondary | ICD-10-CM

## 2014-04-23 DIAGNOSIS — J9610 Chronic respiratory failure, unspecified whether with hypoxia or hypercapnia: Secondary | ICD-10-CM

## 2014-04-23 DIAGNOSIS — J9611 Chronic respiratory failure with hypoxia: Secondary | ICD-10-CM

## 2014-04-23 DIAGNOSIS — J439 Emphysema, unspecified: Secondary | ICD-10-CM

## 2014-04-23 DIAGNOSIS — R0902 Hypoxemia: Secondary | ICD-10-CM

## 2014-04-23 MED ORDER — BUDESONIDE-FORMOTEROL FUMARATE 160-4.5 MCG/ACT IN AERO: 2.0000 | INHALATION_SPRAY | Freq: Two times a day (BID) | RESPIRATORY_TRACT | Status: AC

## 2014-04-23 MED ORDER — ALBUTEROL SULFATE HFA 108 (90 BASE) MCG/ACT IN AERS: 2.0000 | INHALATION_SPRAY | Freq: Four times a day (QID) | RESPIRATORY_TRACT | Status: DC | PRN

## 2014-04-23 NOTE — Patient Instructions (Signed)
You need to get back on symbicort 2 puffs am and pm everyday, and keep your mouth rinsed.  Will send in prescription for you. Stay on spiriva each am Try and stay as active as possible. followup with me again in 55mos.

## 2014-04-23 NOTE — Progress Notes (Signed)
   Subjective:    Patient ID: [name], female    DOB: 11-04-1926, 80 y.o.   MRN: [id number]  HPI The patient comes in today for followup of her known severe COPD with chronic respiratory failure. At the last visit, she was referred to the pulmonary rehabilitation program at [facility], but discontinued the visits because of difficulties with her schedule and transportation. She feels that her breathing is stable since the last visit, and has not had an acute exacerbation or pulmonary infection. I have noticed from her medication list that she has discontinued her Symbicort, but stayed on Spiriva. She tells me this ran out and she did not have a prescription for refill.   Review of Systems  Constitutional: Negative for fever and unexpected weight change.  HENT: Negative for congestion, dental problem, ear pain, nosebleeds, postnasal drip, rhinorrhea, sinus pressure, sneezing, sore throat and trouble swallowing.   Eyes: Negative for redness and itching.  Respiratory: Positive for shortness of breath. Negative for cough, chest tightness and wheezing.   Cardiovascular: Negative for palpitations and leg swelling.  Gastrointestinal: Negative for nausea and vomiting.  Genitourinary: Negative for dysuria.  Musculoskeletal: Negative for joint swelling.  Skin: Negative for rash.  Neurological: Negative for headaches.  Hematological: Does not bruise/bleed easily.  Psychiatric/Behavioral: Negative for dysphoric mood. The patient is not nervous/anxious.        Objective:   Physical Exam Well-developed female in no acute distress Nose without purulence or discharge noted Neck without lymphadenopathy or thyromegaly. Prominent kyphosis noted Chest with decreased breath sounds, a few basilar crackles, but no wheezing. Cardiac exam with regular rate and rhythm, 2/6 systolic murmur Lower extremities with mild edema, no cyanosis Alert and oriented, moves all 4 extremities.         Assessment & Plan:

## 2014-04-23 NOTE — Assessment & Plan Note (Signed)
The patient appears to be stable from a pulmonary standpoint, but does need to get back on her Symbicort along with her Spiriva. Since she is no longer participating in pulmonary rehabilitation, I have asked her to try to stay as active as possible, and to walk frequently around her house with her walker. I will see her back in 6 months if she is doing well.

## 2014-07-19 IMAGING — CR DG CHEST 2V
2 series · 2 of 2 positions shown · non-contrast
Comparison: DG CHEST 2 VIEW dated 11/07/2011

CLINICAL DATA: History of COPD

EXAM:
CHEST  2 VIEW

[view not recorded (1 of 2)]
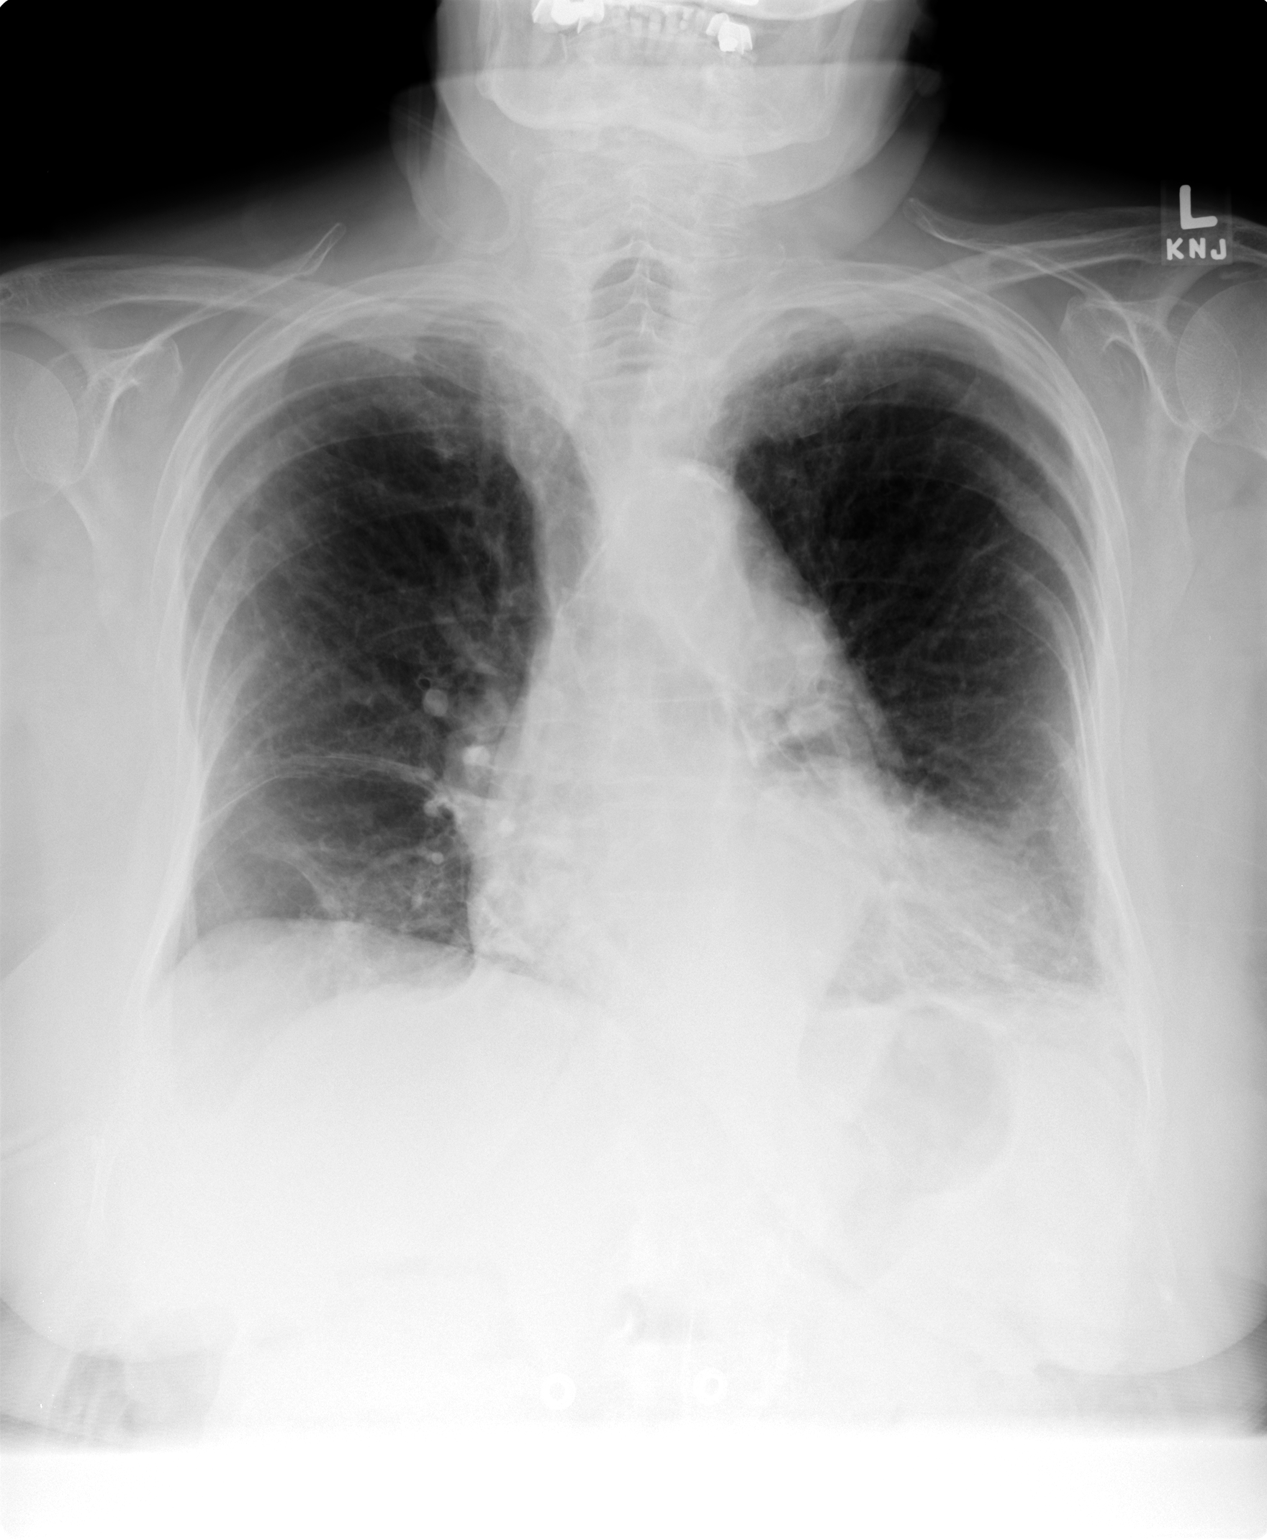

[view not recorded (2 of 2)]
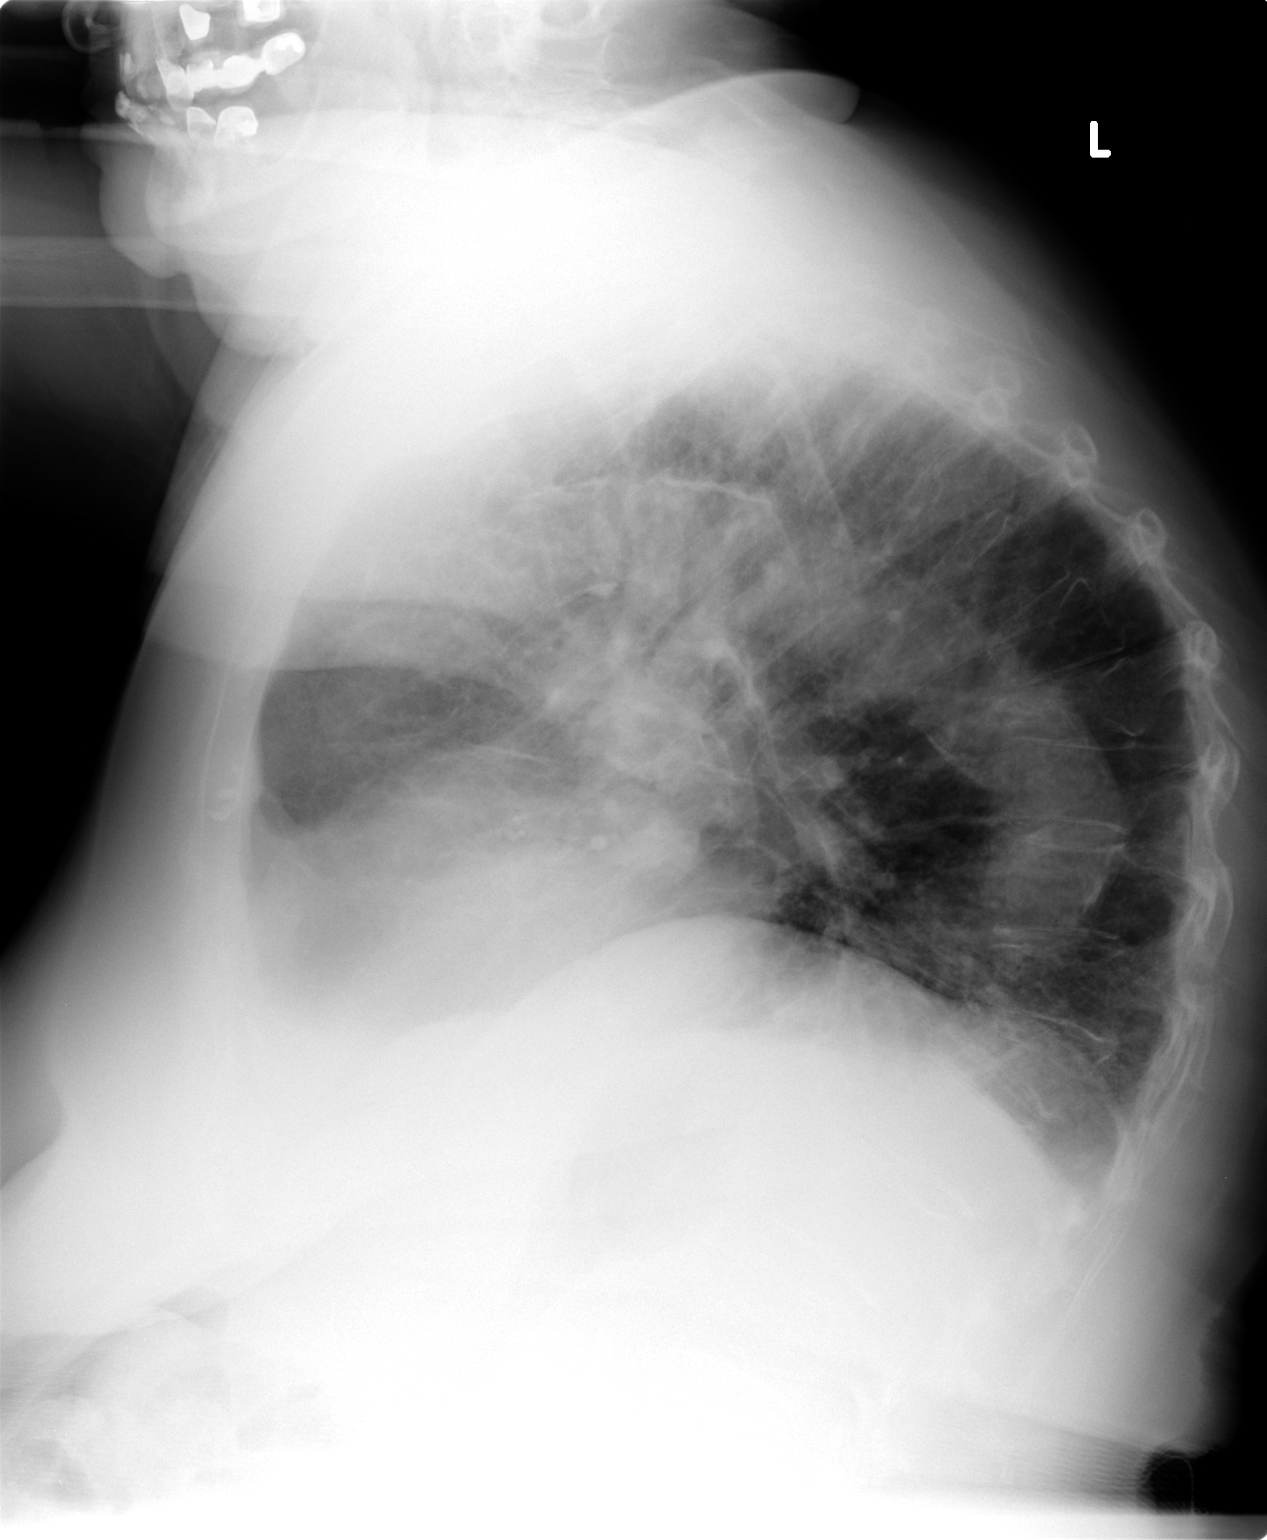

[2 of 2 positions shown; findings below may reference images not displayed]

FINDINGS: Low lung volumes. Linear areas of increased density project within
the right left lung bases. No focal regions of consolidation
appreciated. The bibasilar findings are stable. The aorta is
tortuous and ectatic. Atherosclerotic calcifications identified
within the aorta. The bones are osteopenic. Chronic compression
foreign is identified within the mid and lower thoracic spine. .
There is increased AP diameter of the chest.
IMPRESSION: 1. COPD
2. Chronic bibasilar fibrotic changes
3. No evidence of acute cardiopulmonary disease.

## 2014-09-22 DIAGNOSIS — E876 Hypokalemia: Secondary | ICD-10-CM | POA: Diagnosis not present

## 2014-10-09 DIAGNOSIS — J449 Chronic obstructive pulmonary disease, unspecified: Secondary | ICD-10-CM | POA: Diagnosis not present

## 2014-10-29 ENCOUNTER — Ambulatory Visit: Payer: Medicare Other | Admitting: Pulmonary Disease

## 2014-11-09 DIAGNOSIS — J449 Chronic obstructive pulmonary disease, unspecified: Secondary | ICD-10-CM | POA: Diagnosis not present

## 2014-12-08 DIAGNOSIS — J449 Chronic obstructive pulmonary disease, unspecified: Secondary | ICD-10-CM | POA: Diagnosis not present

## 2014-12-24 ENCOUNTER — Encounter: Payer: Self-pay | Admitting: Pulmonary Disease

## 2014-12-24 ENCOUNTER — Ambulatory Visit (INDEPENDENT_AMBULATORY_CARE_PROVIDER_SITE_OTHER): Payer: Medicare Other | Admitting: Pulmonary Disease

## 2014-12-24 VITALS — BP 136/68 | HR 79 | Temp 97.0°F | Ht 61.0 in | Wt 169.2 lb

## 2014-12-24 DIAGNOSIS — J9611 Chronic respiratory failure with hypoxia: Secondary | ICD-10-CM | POA: Diagnosis not present

## 2014-12-24 DIAGNOSIS — J438 Other emphysema: Secondary | ICD-10-CM | POA: Diagnosis not present

## 2014-12-24 NOTE — Progress Notes (Signed)
   Subjective:    Patient ID: [name], female    DOB: 1927/07/27, 81 y.o.   MRN: [id number]  HPI The patient comes in today for follow-up of her known COPD with chronic respiratory failure. She had been doing very well on her current regimen, but recently went to do to have skin cancer surgery. This was complicated by massive GI bleed with what she describes a shock, as well as a left pneumothorax which required a tube thoracostomy. She feels that she is returning to baseline, and is had no significant acute exacerbation or pulmonary infection.   Review of Systems  Constitutional: Negative for fever and unexpected weight change.  HENT: Negative for congestion, dental problem, ear pain, nosebleeds, postnasal drip, rhinorrhea, sinus pressure, sneezing, sore throat and trouble swallowing.   Eyes: Negative for redness and itching.  Respiratory: Negative for cough, chest tightness, shortness of breath and wheezing.   Cardiovascular: Negative for palpitations and leg swelling.  Gastrointestinal: Negative for nausea and vomiting.  Genitourinary: Negative for dysuria.  Musculoskeletal: Negative for joint swelling.  Skin: Negative for rash.  Neurological: Negative for headaches.  Hematological: Does not bruise/bleed easily.  Psychiatric/Behavioral: Negative for dysphoric mood. The patient is not nervous/anxious.        Objective:   Physical Exam Well-developed female in no acute distress Nose without purulence or discharge noted Neck without lymphadenopathy or thyromegaly Chest with decreased breath sounds diffusely, a few crackles in the left base. Cardiac exam with regular rate and rhythm Lower extremities with mild edema, no cyanosis Alert and oriented, moves all 4 extremities.       Assessment & Plan:

## 2014-12-24 NOTE — Assessment & Plan Note (Signed)
The patient appears to be doing very well from a pulmonary standpoint. I have asked her to try and be as compliant with her medications as possible, and to try and stay as active as she can.

## 2014-12-24 NOTE — Patient Instructions (Signed)
No change in breathing medications.  Try and use symbicort twice a day. Stay as active as you can. followup with me in 29mos.

## 2015-01-05 DIAGNOSIS — K5731 Diverticulosis of large intestine without perforation or abscess with bleeding: Secondary | ICD-10-CM | POA: Diagnosis not present

## 2015-01-05 DIAGNOSIS — I2510 Atherosclerotic heart disease of native coronary artery without angina pectoris: Secondary | ICD-10-CM | POA: Diagnosis not present

## 2015-01-05 DIAGNOSIS — E781 Pure hyperglyceridemia: Secondary | ICD-10-CM | POA: Diagnosis not present

## 2015-01-05 DIAGNOSIS — I10 Essential (primary) hypertension: Secondary | ICD-10-CM | POA: Diagnosis not present

## 2015-01-05 DIAGNOSIS — J939 Pneumothorax, unspecified: Secondary | ICD-10-CM | POA: Diagnosis not present

## 2015-01-08 DIAGNOSIS — J449 Chronic obstructive pulmonary disease, unspecified: Secondary | ICD-10-CM | POA: Diagnosis not present

## 2015-02-07 DIAGNOSIS — J449 Chronic obstructive pulmonary disease, unspecified: Secondary | ICD-10-CM | POA: Diagnosis not present

## 2015-03-10 DIAGNOSIS — J449 Chronic obstructive pulmonary disease, unspecified: Secondary | ICD-10-CM | POA: Diagnosis not present

## 2015-04-07 DIAGNOSIS — L814 Other melanin hyperpigmentation: Secondary | ICD-10-CM | POA: Diagnosis not present

## 2015-04-07 DIAGNOSIS — L720 Epidermal cyst: Secondary | ICD-10-CM | POA: Diagnosis not present

## 2015-04-07 DIAGNOSIS — L570 Actinic keratosis: Secondary | ICD-10-CM | POA: Diagnosis not present

## 2015-04-07 DIAGNOSIS — L821 Other seborrheic keratosis: Secondary | ICD-10-CM | POA: Diagnosis not present

## 2015-04-07 DIAGNOSIS — Z85828 Personal history of other malignant neoplasm of skin: Secondary | ICD-10-CM | POA: Diagnosis not present

## 2015-04-09 DIAGNOSIS — J449 Chronic obstructive pulmonary disease, unspecified: Secondary | ICD-10-CM | POA: Diagnosis not present

## 2015-04-20 DIAGNOSIS — J449 Chronic obstructive pulmonary disease, unspecified: Secondary | ICD-10-CM | POA: Diagnosis not present

## 2015-05-10 DIAGNOSIS — J449 Chronic obstructive pulmonary disease, unspecified: Secondary | ICD-10-CM | POA: Diagnosis not present

## 2015-05-11 DIAGNOSIS — E785 Hyperlipidemia, unspecified: Secondary | ICD-10-CM | POA: Diagnosis not present

## 2015-05-11 DIAGNOSIS — Z0000 Encounter for general adult medical examination without abnormal findings: Secondary | ICD-10-CM | POA: Diagnosis not present

## 2015-05-11 DIAGNOSIS — I2510 Atherosclerotic heart disease of native coronary artery without angina pectoris: Secondary | ICD-10-CM | POA: Diagnosis not present

## 2015-05-11 DIAGNOSIS — I10 Essential (primary) hypertension: Secondary | ICD-10-CM | POA: Diagnosis not present

## 2015-05-11 DIAGNOSIS — M810 Age-related osteoporosis without current pathological fracture: Secondary | ICD-10-CM | POA: Diagnosis not present

## 2015-05-11 DIAGNOSIS — R32 Unspecified urinary incontinence: Secondary | ICD-10-CM | POA: Diagnosis not present

## 2015-05-11 DIAGNOSIS — J449 Chronic obstructive pulmonary disease, unspecified: Secondary | ICD-10-CM | POA: Diagnosis not present

## 2015-05-11 DIAGNOSIS — Z6832 Body mass index (BMI) 32.0-32.9, adult: Secondary | ICD-10-CM | POA: Diagnosis not present

## 2015-05-21 DIAGNOSIS — J449 Chronic obstructive pulmonary disease, unspecified: Secondary | ICD-10-CM | POA: Diagnosis not present

## 2015-06-10 DIAGNOSIS — Z1231 Encounter for screening mammogram for malignant neoplasm of breast: Secondary | ICD-10-CM | POA: Diagnosis not present

## 2015-06-10 DIAGNOSIS — J449 Chronic obstructive pulmonary disease, unspecified: Secondary | ICD-10-CM | POA: Diagnosis not present

## 2015-06-10 DIAGNOSIS — N63 Unspecified lump in breast: Secondary | ICD-10-CM | POA: Diagnosis not present

## 2015-06-20 DIAGNOSIS — J449 Chronic obstructive pulmonary disease, unspecified: Secondary | ICD-10-CM | POA: Diagnosis not present

## 2015-07-10 DIAGNOSIS — J449 Chronic obstructive pulmonary disease, unspecified: Secondary | ICD-10-CM | POA: Diagnosis not present

## 2015-07-21 DIAGNOSIS — J449 Chronic obstructive pulmonary disease, unspecified: Secondary | ICD-10-CM | POA: Diagnosis not present

## 2015-07-22 ENCOUNTER — Ambulatory Visit: Payer: Medicare Other | Admitting: Pulmonary Disease

## 2015-08-10 DIAGNOSIS — J449 Chronic obstructive pulmonary disease, unspecified: Secondary | ICD-10-CM | POA: Diagnosis not present

## 2015-08-20 DIAGNOSIS — J449 Chronic obstructive pulmonary disease, unspecified: Secondary | ICD-10-CM | POA: Diagnosis not present

## 2015-09-09 DIAGNOSIS — J449 Chronic obstructive pulmonary disease, unspecified: Secondary | ICD-10-CM | POA: Diagnosis not present

## 2015-09-20 DIAGNOSIS — J449 Chronic obstructive pulmonary disease, unspecified: Secondary | ICD-10-CM | POA: Diagnosis not present

## 2015-10-08 DIAGNOSIS — L821 Other seborrheic keratosis: Secondary | ICD-10-CM | POA: Diagnosis not present

## 2015-10-08 DIAGNOSIS — L814 Other melanin hyperpigmentation: Secondary | ICD-10-CM | POA: Diagnosis not present

## 2015-10-08 DIAGNOSIS — D1800 Hemangioma unspecified site: Secondary | ICD-10-CM | POA: Diagnosis not present

## 2015-10-08 DIAGNOSIS — L570 Actinic keratosis: Secondary | ICD-10-CM | POA: Diagnosis not present

## 2015-10-08 DIAGNOSIS — Z85828 Personal history of other malignant neoplasm of skin: Secondary | ICD-10-CM | POA: Diagnosis not present

## 2015-10-10 DIAGNOSIS — J449 Chronic obstructive pulmonary disease, unspecified: Secondary | ICD-10-CM | POA: Diagnosis not present

## 2015-10-20 DIAGNOSIS — Z85828 Personal history of other malignant neoplasm of skin: Secondary | ICD-10-CM | POA: Diagnosis not present

## 2015-10-20 DIAGNOSIS — C44319 Basal cell carcinoma of skin of other parts of face: Secondary | ICD-10-CM | POA: Diagnosis not present

## 2015-10-20 DIAGNOSIS — D485 Neoplasm of uncertain behavior of skin: Secondary | ICD-10-CM | POA: Diagnosis not present

## 2015-10-21 DIAGNOSIS — J449 Chronic obstructive pulmonary disease, unspecified: Secondary | ICD-10-CM | POA: Diagnosis not present

## 2015-11-10 DIAGNOSIS — J449 Chronic obstructive pulmonary disease, unspecified: Secondary | ICD-10-CM | POA: Diagnosis not present

## 2015-11-12 ENCOUNTER — Other Ambulatory Visit: Payer: Self-pay | Admitting: *Deleted

## 2015-11-12 DIAGNOSIS — J441 Chronic obstructive pulmonary disease with (acute) exacerbation: Secondary | ICD-10-CM

## 2015-11-12 NOTE — Patient Outreach (Signed)
[facility]) Care Management  11/12/2015  [name] Aug 30, 1927 [id number]   RN Health Coach telephone call to patient from [facility] COPD high list. .  [employer] compliance verified. This patient was released from a nursing home facility. She has a caregiver Bunnie Philips that come M-W-F.  The patient lives alone.The patient is on continuous oxygen at home. The caregiver provides transportation to her physician appointments. The patient thinks she takes around 6 medications. She does not know the names. The caregiver puts the medications in the med box and patient takes them.  Per patient there is one medication that she can't afford but does not know the name. Patient uses a walker and sleeps in a recliner. Per patient stated that she needs a will and advance directive. Per patient she is paying the caregiver out of her money and she is afraid it is running out and she doesn't want to go back to a nursing home.  Patient PCP is Dr Reinaldo Meeker in [place].  Plan: Referral to social worker for financial analysis, Adv Directive and look at [employer] for caregivers. Referral to Pharmacy because patient doesn't know the medications she is on and stated she couldn't afford one of them. Referral to Community Nurse to assess this situation in full.   Johny Shock, BSN, RN [facility] Care Management RN Health Coach Phone: [facility] complies with applicable Federal civil rights laws and does not discriminate on the basis of race, color, national origin, age, disability, or sex. Espaol (Spanish)  [facility] cumple con las leyes federales de derechos civiles aplicables y no discrimina por motivos de raza, color, nacionalidad, edad, discapacidad o sexo.    [place] ([place]  [place] tun th? lu?t dn quy?n hi?n hnh c?a Lin bang v khng phn bi?t ?i x? d?a trn ch?ng t?c, mu da, ngu?n g?c qu?c gia, ?  tu?i, khuy?t t?t, ho?c gi?i tnh.    (Arabic)    [place]                      .

## 2015-11-16 ENCOUNTER — Encounter: Payer: Self-pay | Admitting: *Deleted

## 2015-11-17 ENCOUNTER — Other Ambulatory Visit: Payer: Self-pay | Admitting: *Deleted

## 2015-11-17 ENCOUNTER — Encounter: Payer: Self-pay | Admitting: *Deleted

## 2015-11-17 NOTE — Patient Outreach (Signed)
[place] [facility]) Care Management  11/17/2015  [name] Oct 30, 1926 [id number]  Initial telephone call to [name], HIPAA compliance verified and received verbal consent to speak with patient about [facility] care management referral for community care coordinator for home visit.  [name] reports that she lives a home alone and she has a home health aide, [name] that stays with her 6 hours a day on Monday, Wednesday and Friday. [name] makes her appointments on those days so that [name] will be available to go with her, patient states she normally hires someone to drive her car to appointment, that way [name] is available to help her. [name] uses a walker at home, sleeps in her recliner at night. [name] reports that she currently has all of her medication and [name] helps her with medication by putting pills in a pill box. Patient is not able to recall names of all of medication, but describes one perhaps Sprivia " its a pill that goes in a round disc, the pill is crushed and I inhale it through disc, patient reports this is her most expensive medications she currently uses it daily. Patient denies shortness of breath , increased cough or sputum production. Discussed with patient to notify MD of problems with breathing. Patient unable to recall any specific care management needs at present, Will complete home visit to assess needs and provide education on COPD education. [name] denies immediate concerns at present, I provided patient with my contact number and encouraged to call if concerns arise.  Patient requested home visit be scheduled on a day that  her caregiver is present and not on a Monday or Friday.  Plan Initial home visit scheduled on Wednesday, March 15 at 10 am. I will send a letter of involvement to PCP.  [name], RN, [facility] [phone]- Mobile [phone]- Toll Free Main Office

## 2015-11-18 DIAGNOSIS — J449 Chronic obstructive pulmonary disease, unspecified: Secondary | ICD-10-CM | POA: Diagnosis not present

## 2015-11-18 NOTE — Patient Outreach (Signed)
[place] [facility]) Care Management  11/18/2015  SHYLAR DRYMON April 12, 1927 [id number]   Request received from [facility], LCSW to mail patient information on Advanced Directives and information on private duty agencies. Information mailed today, 11/18/15.  Jacqulynn Cadet  [facility] Care Management Assistant

## 2015-11-23 ENCOUNTER — Other Ambulatory Visit: Payer: Self-pay | Admitting: *Deleted

## 2015-11-23 NOTE — Patient Outreach (Signed)
[facility]) Care Management  11/23/2015  [name] 18-Dec-1926 [id number]  CSW received a new referral on patient from Johny Shock, Telephonic Nurse Case Manager with [facility], reporting that patient would benefit from social work services and resources to assist with completion of Advanced Directives ([place] documents). In addition, Mrs. Pleasant indicated that patient would like to receive additional resource information pertaining to private agency sitters.  CSW was able to mail patient a packet of resource information, along with an Advanced Directives packet, directly to patient's home for her review.  CSW will agree to assist with completion, as well as answer any questions that patient may have. CSW made an initial attempt to try and contact patient today, on both cell number and home number, to perform phone assessment; however, patient was unavailable.  A HIPAA compliant message was left on voicemail.  CSW is awaiting a return call.  [name], BSW, MSW, LCSW  Licensed Education officer, environmental Health System  Mailing [place] [address] [place], [place] [address] Physical Address-[address], [place], [place] [address] Toll Free Main # [phone] Fax # [phone] Cell # [phone]  Fax # [phone]  [name]   Patient's preferred language:  [place]   English  ATTENTION:  If you speak [place], language assistance services, free of charge, are available to you.    Nondiscrimination and Accessibility Statement: Discrimination is Against the [employer], a subsidiary of [employer], complies with [employer] civil rights laws and does not discriminate on the basis of race, color, national origin, age, disability, or sex.  [place] does not exclude people or treat them  differently because of race, color, national origin, age, disability, or sex.  [place] Providers will:  . Provide free aids and services to people with disabilities to communicate effectively with [place], such as:     ? Qualified sign language interpreters  ? Written information in other formats (large print, audio, accessible electronic formats, other formats)   . Provide free language services to people whose primary language is not [place], such as:    ? Qualified interpreters    ? Information written in other languages   If you need these services, contact your [facility] Fore[facility] psychologist.  If you believe that a [employer] has failed to provide these services or discriminated in another way on the basis of race, color, national origin, age, disability, or sex, you can file a Tourist information centre manager with: [place], [phone] or [email or web address]  You can file a grievance in person or by mail, fax, or email. If you need help filing a grievance, you may contact Valrie Hart, Interim Compliance Officer, [facility] Department of Compliance and Integrity, [address], [place], [place]. [place], [phone], [email or web address].    You can also file a civil rights complaint with the U.S. Department of Health and Financial controller, Office for [employer], electronically through the Office for Civil Rights Complaint Portal, available at OnSiteLending.nl.jsf, or by mail or phone at:  [place]. Department of Health and Human Services [address], [place] Room [phone], [facility] Building [place], [place]  [phone], [phone] (TDD) Complaint forms are available at [email or web address].

## 2015-12-01 ENCOUNTER — Other Ambulatory Visit: Payer: Self-pay | Admitting: *Deleted

## 2015-12-01 ENCOUNTER — Other Ambulatory Visit: Payer: Self-pay | Admitting: Pharmacist

## 2015-12-01 ENCOUNTER — Encounter: Payer: Self-pay | Admitting: *Deleted

## 2015-12-01 NOTE — Patient Outreach (Signed)
[place] [facility]) Care Management  12/01/2015  [name] January 06, 1927 [id number]   CSW was able to make brief contact with patient today, to ensure that patient received the packet of resource information that CSW mailed to patient's home.  CSW was first able to obtain two HIPAA compliant identifiers from patient, which included patient's name and date of birth.  CSW then explained the reason for the call, indicating that patient's RNCM with [facility], [name] thought that patient would benefit from social work services and resources to assist with referrals to various community agencies and resources.  Patient admitted that she received the packet of resource information, containing an Financial controller packet (Living Will and [place]), a list of private agency sitters and a Engineer, maintenance (IT).  Patient agreed to return CSW's call after her company left, indicating that two members of the [place] Management team, [name] and Karrie Meres, Pharmacist, were present at the time of CSW's call.  CSW is currently awaiting a return call.  [name], BSW, MSW, LCSW  Licensed Education officer, environmental Health System  Mailing [place] [address] [place], [place] [address] Physical Address[address], [place], [place] [address] Toll Free Main # [phone] Fax # [phone] Cell # [phone]  Fax # [phone]  [name]  Patient's preferred language:  [place]   English  ATTENTION:  If you speak [place], language assistance services, free of charge, are available to you.    Nondiscrimination and Accessibility Statement: Discrimination is Against the [employer], a subsidiary of [employer], complies with [employer] civil rights  laws and does not discriminate on the basis of race, color, national origin, age, disability, or sex.  [place] does not exclude people or treat them differently because of race, color, national origin, age, disability, or sex.  [place] Providers will:  . Provide free aids and services to people with disabilities to communicate effectively with [place], such as:     ? Qualified sign language interpreters  ? Written information in other formats (large print, audio, accessible electronic formats, other formats)   . Provide free language services to people whose primary language is not [place], such as:    ? Qualified interpreters    ? Information written in other languages   If you need these services, contact your [facility] Fore[facility] psychologist.  If you believe that a [employer] has failed to provide these services or discriminated in another way on the basis of race, color, national origin, age, disability, or sex, you can file a Tourist information centre manager with: [place], [phone] or [email or web address]  You can file a grievance in person or by mail, fax, or email. If you need help filing a grievance, you may contact Valrie Hart, Interim Compliance Officer, [facility] Department of Compliance and Integrity, [address], [place], [place]. [place], [phone], [email or web address].    You can also file a civil rights complaint with the U.S. Department of Health and Financial controller, Office for [employer], electronically through the Office for Civil Rights Complaint Portal, available at OnSiteLending.nl.jsf, or by mail or phone at:  [place]. Department of Health and Human Services [address] [place] Room [phone], [facility] Building [place], [place]  [phone], [phone] (TDD) Complaint forms are available at  [email or web address].

## 2015-12-01 NOTE — Patient Outreach (Signed)
[place] [facility]) Care Management  [place]   12/01/2015  [name] March 06, 1927 [id number]  Subjective:  [name] is an 82 year old female who was referred to [place] Pharmacist for review of medications and assistance with medication cost.    [facility] Clinical Pharmacist completed an initial patient home visit this morning with [name], [name].    Patient reports that she is "doing well" and her caregiver, [name] was also present at time of home visit, per patient request and permission [name] was also spoken to.   Patient reports that her main pharmacy concern is medication cost, specifically with Spiriva inhaler.   Patient and caregiver report that caregiver fills pill boxes for patient and patient takes her medications from pill box.    Patient and caregiver report that patient has not used Symbicort in "some time."  Patient reports no recent use of ProAir.  Patient and caregiver deny any recent use of furosemide or potassium chloride.    Objective:    [facility].  Her medication list in Epic was reviewed with [facility] along with scanned office note from PCP dated 04/2015.   [facility], [place], [place] and was told Spiriva filled 09/2015 ($215 copay), 10/2015 ($45 copay), and 11/2015 ($45 copay).   [facility] Symbicort last filled 04/2015.  Proair in patient possession was filled 04/2014 per prescription label, with expiration date of 11/2015 on inhaler and dose counter indicated 200 inhalations remain.        Current Medications: Current Outpatient Prescriptions  Medication Sig Dispense Refill  . donepezil (ARICEPT) 5 MG tablet Take 5 mg by mouth at bedtime.    . fish oil-omega-3 fatty acids 1000 MG capsule Take 1 capsule by mouth daily.    . metoprolol succinate (TOPROL-XL) 25 MG 24 hr tablet Take 25 mg by mouth daily.     . Multiple Vitamins-Minerals  (MULTIVITAMIN WITH MINERALS) tablet Take 1 tablet by mouth daily.    . simvastatin (ZOCOR) 40 MG tablet Take 40 mg by mouth daily.    Marland Kitchen SPIRIVA HANDIHALER 18 MCG inhalation capsule INHALE CONTENTS OF 1 CAPSULE ONCE DAILY 30 capsule 5  . acetaminophen (TYLENOL) 500 MG tablet Take 500 mg by mouth every 6 (six) hours as needed. Reported on 12/01/2015    . albuterol (PROVENTIL HFA;VENTOLIN HFA) 108 (90 BASE) MCG/ACT inhaler Inhale 2 puffs into the lungs every 6 (six) hours as needed. For wheezing (Patient not taking: Reported on 12/01/2015) 1 Inhaler 6  . budesonide-formoterol (SYMBICORT) 160-4.5 MCG/ACT inhaler Inhale 2 puffs into the lungs 2 (two) times daily. (Patient not taking: Reported on 12/01/2015) 1 Inhaler 6  . furosemide (LASIX) 20 MG tablet Take 20 mg by mouth daily as needed. Reported on 12/01/2015    . KLOR-CON 10 10 MEQ tablet Take 20 mEq by mouth daily. Reported on 12/01/2015     No current facility-administered medications for this visit.    Functional Status: In your present state of health, do you have any difficulty performing the following activities: 11/17/2015 11/12/2015  Hearing? Tempie Donning  Vision? Y Y  Difficulty concentrating or making decisions? Y -  Walking or climbing stairs? Y -  Dressing or bathing? Y -  Doing errands, shopping? Y -  Conservation officer, nature and eating ? Y -  Using the Toilet? Y -  In the past six months, have you accidently leaked  urine? Y -  Do you have problems with loss of bowel control? Y -  Managing your Medications? Y -  Managing your Finances? N -  Housekeeping or managing your Housekeeping? Y -    Fall/Depression Screening: PHQ 2/9 Scores 11/17/2015 11/12/2015  PHQ - 2 Score 0 1    Assessment:  Reviewed patient's MA-PDP and based on plan summary of benefits and preferred drug list, appears she had a $170 deductible which applied to Tier 3, 4, and 5 medications during 2017 plan year.  This may be why Spiriva cost so much 09/2015.   Discussed with  patient Social Designer, jewellery Help program income and resource requirements. Based on patient report of land that she has, it was discussed she may exceed the resources requirement, and patient did not wish to apply at this time.    Also discussed [place] patient assistance program.  Patient wished to not apply at this time.   Discussed that patient could obtain a 90 day supply of Tier 1 and Tier 2 medications via [facility] for $0 co-pay with her MA-PDP and she stated she preferred [facility].      Plan:  1) Symbicort:  Patient does not have this medication her possession and denies use of it.  Given patient report of not using it, and also that ProAir inhaler still has 200 inhalations in it, consider verifying if this medication should be removed from her active medication list.    2) ProAir:  She has no active prescription for albuterol inhaler at her pharmacy and her current ProAir will expire this month.  Recommend a new prescription for albuterol inhaler be sent to patient's pharmacy of choice should she need a refill.    3) Furosemide and potassium chloride:  Patient and caregiver report patient is not presently using these medications.  Please clarify if patient is to be using these medications.  [name], PharmD, [place] [phone]

## 2015-12-01 NOTE — Patient Outreach (Signed)
[name]   12/01/2015  Amber [name] 03/26/1927 [id number]  Amber [name] is an 82 y.o. female  Subjective: " I think I have been doing pretty good, I haven't had any breathing problems".  Objective:   Review of Systems  Constitutional: Negative.   HENT: Negative.   Eyes: Negative.   Cardiovascular: Positive for leg swelling. Negative for chest pain.       Bilateral lower edema swelling  Gastrointestinal: Negative.   Genitourinary: Negative.   Musculoskeletal: Negative.   Skin: Negative.   Neurological: Negative.   Endo/Heme/Allergies: Negative.   Psychiatric/Behavioral:       Reports memory problems    Physical Exam  Constitutional: She is oriented to person, place, and time. She appears well-developed and well-nourished.  Cardiovascular: Normal rate, normal heart sounds and intact distal pulses.   Respiratory: Effort normal and breath sounds normal.  GI: Soft.  Musculoskeletal:  Patient's posture leans forward  Neurological: She is alert and oriented to person, place, and time.  Skin: Skin is warm, dry and intact. There is erythema.     Psychiatric: She has a normal mood and affect. Her behavior is normal. Judgment and thought content normal.  States problems with memory    Current Medications:   Current Outpatient Prescriptions  Medication Sig Dispense Refill  . acetaminophen (TYLENOL) 500 MG tablet Take 500 mg by mouth every 6 (six) hours as needed.    Marland Kitchen albuterol (PROVENTIL HFA;VENTOLIN HFA) 108 (90 BASE) MCG/ACT inhaler Inhale 2 puffs into the lungs every 6 (six) hours as needed. For wheezing 1 Inhaler 6  . budesonide-formoterol (SYMBICORT) 160-4.5 MCG/ACT inhaler Inhale 2 puffs into the lungs 2 (two) times daily. 1 Inhaler 6  . donepezil (ARICEPT) 5 MG tablet Take 5 mg by mouth at bedtime.    . fish oil-omega-3 fatty acids 1000 MG capsule Take 1 capsule by mouth daily.    . furosemide (LASIX) 20 MG tablet Take 20 mg by  mouth daily as needed.     Marland Kitchen KLOR-CON 10 10 MEQ tablet Take 20 mEq by mouth daily.     . metoprolol succinate (TOPROL-XL) 25 MG 24 hr tablet Take 25 mg by mouth daily.     . Multiple Vitamins-Minerals (MULTIVITAMIN WITH MINERALS) tablet Take 1 tablet by mouth daily.    . simvastatin (ZOCOR) 40 MG tablet Take 40 mg by mouth daily.    Marland Kitchen SPIRIVA HANDIHALER 18 MCG inhalation capsule INHALE CONTENTS OF 1 CAPSULE ONCE DAILY 30 capsule 5   No current facility-administered medications for this visit.    Functional Status:   In your present state of health, do you have any difficulty performing the following activities: 11/17/2015 11/12/2015  Hearing? Amber [name]  Vision? Y Y  Difficulty concentrating or making decisions? Y -  Walking or climbing stairs? Y -  Dressing or bathing? Y -  Doing errands, shopping? Y -  Conservation officer, nature and eating ? Y -  Using the Toilet? Y -  In the past six months, have you accidently leaked urine? Y -  Do you have problems with loss of bowel control? Y -  Managing your Medications? Y -  Managing your Finances? N -  Housekeeping or managing your Housekeeping? Y -    Fall/Depression Screening:    PHQ 2/9 Scores 11/17/2015 11/12/2015  PHQ - 2 Score 0 1    Assessment:  Routine initial home visit, patient resting in her lift recliner chair, co visit with Amber Bihari  [name], pharmacist, Amber [name]'s personal care assistant Amber [name] present. Patient has hardwood floors, she keeps her frequently used items near her chairside because she sleep there as well. Patient wearing her alert button and states she is aware of how to use and they have tested in the the past year.   COPD Patient report no breathing problems, she is wearing oxygen at 3 liters via  nasal cannula, Educated on COPD zones and importance of taking action for symptoms in yellow zone, patient indicates she in green zone on today.  Fall Risk Patient denies having a fall in greater than 3 years. She keeps her  walker at chairside and is able to walk to the bathroom without problems, cautioned patient regarding making sure oxygen tubing is out of her way when walking to avoid tripping.  Leg Swelling Patient noted with bilateral lower edema swelling,also redden at both calf areas,patient states that it has looked like that for a good while . Amber [name] does not weigh, and currently her scale do not work, but states she will try a battery or get new scales. Patient reports her last visit with PCP has been a while, per office note.last visit was 05/11/15, Amber [name] reports patient cancelled last scheduled visit, encouraged patient to schedule follow up visit.   Plan:  RNCM will follow up with patient on 12/28/15 Initial visit note will be sent to PCP Patient will consider follow up PCP visit appointment   Amber [name]        Most Recent Value   Care Plan Problem [name]  COPD self management care   Role Documenting the Problem [name]  Care Management Coordinator   Care Plan for Problem [name]  Active   Amber [name] (31-90 days)  Patient will verbalize increased knowledge of COPD self care management   [facility] Long Term Cain Start Date  12/01/15   Interventions for Problem [name] Long Term Cain  Educated on COPD zones, importance of taking medicatons as prescribed, and notify MD of  worsening symptoms to arrange office visit.   Amber [name] (0-30 days)  Patient will be able to state at least 3 symptoms of COPD yellow zone and action plan   [facility] CM Short Term Cain #1 Start Date  12/01/15   Interventions for Short Term Cain #1  Educated on COPD all zones, provided COPD handout,Amber calendar review of  COPD action plans, and provided Magnet   Amber CM Short Term Cain #2 (0-30 days)  Patient will report increased knowledge of importance of balanced diet and low salt diet adherence in the next 30 days   Amber CM Short Term Cain #2 Start Date  12/01/15   Interventions for Short Term Cain #2  Educated  on importance of balanced diet to help with fighting off infection,,discussed healthy food options of including protein, fresh fruits vegetables, limit process food, reviewed label of frozen foods salt content , to limit   Amber CM Short Term Cain #3 (0-30 days)  Patient will obtain scales and begin to weight daily and record in the next 30 days   Amber CM Short Term Cain #3 Start Date  12/01/15   Interventions for Short Tern Cain #3  Educated on tracking for weight gain or loss, to detect, small changes in weight gain, as swelling noted, when monitoring daily, reviewed how to record in Amber [name]     [name], RN, [place] Management [phone] Mobile  [phone]- Toll Free [place] complies with applicable Federal civil rights laws and does not discriminate on the basis of race, color, national origin, age, disability, or sex. Espaol (Spanish)  [place] cumple con las leyes federales de derechos civiles aplicables y no discrimina por motivos de raza, color, nacionalidad, edad, discapacidad o sexo.  Ti?ng Vi?t ([place])  [place] tun th? lu?t dn quy?n hi?n hnh c?a Lin bang v khng phn bi?t ??i x? d?a trn ch?ng t?c, mu da, ngu?n g?c qu?c gia, ?? tu?i, khuy?t t?t, ho?c gi?i tnh.   (Arabic)   [place]                      .

## 2015-12-02 ENCOUNTER — Ambulatory Visit: Payer: Self-pay | Admitting: *Deleted

## 2015-12-02 ENCOUNTER — Encounter: Payer: Self-pay | Admitting: Pharmacist

## 2015-12-02 ENCOUNTER — Ambulatory Visit: Payer: Self-pay | Admitting: Pharmacist

## 2015-12-03 ENCOUNTER — Other Ambulatory Visit: Payer: Self-pay | Admitting: *Deleted

## 2015-12-08 DIAGNOSIS — J449 Chronic obstructive pulmonary disease, unspecified: Secondary | ICD-10-CM | POA: Diagnosis not present

## 2015-12-10 ENCOUNTER — Other Ambulatory Visit: Payer: Self-pay | Admitting: *Deleted

## 2015-12-11 NOTE — Patient Outreach (Signed)
[facility]) Care Management  12/11/2015  [name] 05-01-1927 [id number]   CSW made a second attempt to try and contact patient today to perform phone assessment, as well as assess and assist with social work needs and services, without success.  A HIPAA compliant message was left for patient on voicemail.  CSW is currently awaiting a return call.  [name], BSW, MSW, LCSW  Licensed Education officer, environmental Health System  Mailing [place] [address] [place], [place] [address] Physical Address-[address], [place],  [address] Toll Free Main # [phone] Fax # [phone] Cell # [phone]  Fax # [phone]  [name]  Patient's preferred language:  [place]   English  ATTENTION:  If you speak [place], language assistance services, free of charge, are available to you.    Nondiscrimination and Accessibility Statement: Discrimination is Against the [employer], a subsidiary of [employer], complies with [employer] civil rights laws and does not discriminate on the basis of race, color, national origin, age, disability, or sex.  [place] does not exclude people or treat them differently because of race, color, national origin, age, disability, or sex.  [place] Providers will:  . Provide free aids and services to people with disabilities to communicate effectively with [place], such as:     ? Qualified sign language interpreters  ? Written information in other formats (large print, audio, accessible electronic formats, other formats)   . Provide free language services to people whose primary language is not [place], such as:    ? Qualified interpreters    ? Information written in other languages   If you need these services, contact your [facility] psychologist.  If you believe that a [facility]  [employer] has failed to provide these services or discriminated in another way on the basis of race, color, national origin, age, disability, or sex, you can file a Tourist information centre manager with: [place], [phone] or [email or web address]  You can file a grievance in person or by mail, fax, or email. If you need help filing a grievance, you may contact Valrie Hart, Interim Compliance Officer, [facility] Department of Compliance and Integrity, [place]nd Floor, [place], [place]. [place], [phone], [email or web address].    You can also file a civil rights complaint with the U.S. Department of Health and Financial controller, Office for [employer], electronically through the Office for Civil Rights Complaint Portal, available at OnSiteLending.nl.jsf, or by mail or phone at:  [place]. Department of Health and Human Services [address] [place] Room [phone], [facility] Building [place], [place]  [phone], [phone] (TDD) Complaint forms are available at [email or web address].

## 2015-12-15 ENCOUNTER — Encounter: Payer: Self-pay | Admitting: *Deleted

## 2015-12-15 ENCOUNTER — Other Ambulatory Visit: Payer: Self-pay | Admitting: *Deleted

## 2015-12-15 NOTE — Patient Outreach (Signed)
[name]) Care Management  12/15/2015  [name] [birth year] [id number]   CSW made a third and final attempt to try and contact patient today to perform phone assessment, as well as assess and assist with social work needs and services, without success.  A HIPAA compliant message was left for patient on voicemail.  CSW  continues to await a return call.  CSW will mail an outreach letter to patient's home, encouraging patient to contact CSW at their earliest convenience, if patient is interested in receiving social work services through [place] with [facility].  If CSW does not receive a return call from patient within the next 10 business days, CSW will proceed with case closure.  Required number of phone attempts will have been made and outreach letter mailed.    [name], BSW, MSW, LCSW  Licensed Education officer, environmental Health System  Mailing [place] [address] [place], [place] [address] Physical Address-[address], [place], [place] [address] Toll Free Main # [phone] Fax # [phone] Cell # [phone]  Fax # [phone]  [name]  Patient's preferred language:  [place]   English  ATTENTION:  If you speak [place], language assistance services, free of charge, are available to you.    Nondiscrimination and Accessibility Statement: Discrimination is Against the [employer], a subsidiary of [employer], complies with [employer] civil rights laws and does not discriminate on the basis of race, color, national origin, age, disability, or sex.  [place] does not exclude people or treat them differently because of race, color, national origin, age, disability, or sex.  [place] Providers will:  . Provide free aids and services to people with disabilities to communicate effectively with [place],  such as:     ? Qualified sign language interpreters  ? Written information in other formats (large print, audio, accessible electronic formats, other formats)   . Provide free language services to people whose primary language is not [place], such as:    ? Qualified interpreters    ? Information written in other languages   If you need these services, contact your [facility] psychologist.  If you believe that a [facility] [employer] has failed to provide these services or discriminated in another way on the basis of race, color, national origin, age, disability, or sex, you can file a Tourist information centre manager with: [place], [phone] or [email or web address]  You can file a grievance in person or by mail, fax, or email. If you need help filing a grievance, you may contact Valrie Hart, Interim Compliance Officer, [facility] Department of Compliance and Integrity, [place]nd Floor, [place], [place]. [place], [phone], [email or web address].    You can also file a civil rights complaint with the U.S. Department of Health and Financial controller, Office for [employer], electronically through the Office for Civil Rights Complaint Portal, available at OnSiteLending.nl.jsf, or by mail or phone at:  [place]. Department of Health and Human Services [address], [place] Room [phone], [facility] Building [place], [place]  [phone], [phone] (TDD) Complaint forms are available at [email or web address].

## 2015-12-17 DIAGNOSIS — I10 Essential (primary) hypertension: Secondary | ICD-10-CM | POA: Diagnosis not present

## 2015-12-17 DIAGNOSIS — R32 Unspecified urinary incontinence: Secondary | ICD-10-CM | POA: Diagnosis not present

## 2015-12-17 DIAGNOSIS — G309 Alzheimer's disease, unspecified: Secondary | ICD-10-CM | POA: Diagnosis not present

## 2015-12-17 DIAGNOSIS — E785 Hyperlipidemia, unspecified: Secondary | ICD-10-CM | POA: Diagnosis not present

## 2015-12-17 DIAGNOSIS — I2510 Atherosclerotic heart disease of native coronary artery without angina pectoris: Secondary | ICD-10-CM | POA: Diagnosis not present

## 2015-12-19 DIAGNOSIS — J449 Chronic obstructive pulmonary disease, unspecified: Secondary | ICD-10-CM | POA: Diagnosis not present

## 2015-12-28 ENCOUNTER — Other Ambulatory Visit: Payer: Self-pay | Admitting: *Deleted

## 2015-12-28 ENCOUNTER — Encounter: Payer: Self-pay | Admitting: *Deleted

## 2015-12-28 NOTE — Patient Outreach (Signed)
[place] [facility]) Care Management   12/28/2015  [name] 12-11-1926 [id number]  [name] is an 82 y.o. female  Subjective: " I guess I am doing okay, no breathing problems"  Objective:  BP 140/80 mmHg  Pulse 80  Resp 18  SpO2 92% Review of Systems  Constitutional: Negative.   HENT: Negative.   Eyes: Negative.   Respiratory: Negative for cough, shortness of breath and wheezing.   Cardiovascular: Positive for leg swelling.  Gastrointestinal: Negative.   Genitourinary: Negative.   Musculoskeletal: Negative for falls.  Skin: Negative.   Neurological: Negative.   Endo/Heme/Allergies: Negative.   Psychiatric/Behavioral: Positive for memory loss.       Reports some problems with memory    Physical Exam  Constitutional: She is oriented to person, place, and time. She appears well-developed and well-nourished.  Cardiovascular: Normal rate, regular rhythm, normal heart sounds and intact distal pulses.   Swelling bilateral lower legs, especially above area where diabetic socks ends at just below knee area.  Respiratory: Effort normal and breath sounds normal.  GI: Soft.  Neurological: She is alert and oriented to person, place, and time.  Skin: Skin is warm and dry.     Psychiatric: She has a normal mood and affect. Her behavior is normal. Judgment and thought content normal.    Encounter Medications:   Outpatient Encounter Prescriptions as of 12/28/2015  Medication Sig Note  . acetaminophen (TYLENOL) 500 MG tablet Take 500 mg by mouth every 6 (six) hours as needed. Reported on 12/01/2015 12/01/2015: Patient and caregiver report not taken in past 30 days  . budesonide-formoterol (SYMBICORT) 160-4.5 MCG/ACT inhaler Inhale 2 puffs into the lungs 2 (two) times daily. 12/28/2015: Takes at list 3 days a week  . donepezil (ARICEPT) 5 MG tablet Take 5 mg by mouth at bedtime.   . fish oil-omega-3 fatty acids 1000 MG capsule Take 1 capsule by mouth daily.   .  metoprolol succinate (TOPROL-XL) 25 MG 24 hr tablet Take 25 mg by mouth daily.    . Multiple Vitamins-Minerals (MULTIVITAMIN WITH MINERALS) tablet Take 1 tablet by mouth daily.   . simvastatin (ZOCOR) 40 MG tablet Take 40 mg by mouth daily.   Marland Kitchen SPIRIVA HANDIHALER 18 MCG inhalation capsule INHALE CONTENTS OF 1 CAPSULE ONCE DAILY   . triamcinolone cream (KENALOG) 0.1 % Apply 1 application topically daily as needed.   Marland Kitchen albuterol (PROVENTIL HFA;VENTOLIN HFA) 108 (90 BASE) MCG/ACT inhaler Inhale 2 puffs into the lungs every 6 (six) hours as needed. For wheezing (Patient not taking: Reported on 12/01/2015) 12/01/2015: Patient reports that she has not used this medication in past 30 days   . furosemide (LASIX) 20 MG tablet Take 20 mg by mouth daily as needed. Reported on 12/28/2015 12/01/2015: Patient and caregiver report not used in past 6 months.    Marland Kitchen KLOR-CON 10 10 MEQ tablet Take 20 mEq by mouth daily. Reported on 12/28/2015 12/01/2015: Caregiver reports was only to be used when taking furosemide and patient and caregiver report not taken in past 6 months    No facility-administered encounter medications on file as of 12/28/2015.    Functional Status:   In your present state of health, do you have any difficulty performing the following activities: 12/28/2015 11/17/2015  Hearing? Tempie Donning  Vision? N Y  Difficulty concentrating or making decisions? Tempie Donning  Walking or climbing stairs? Y Y  Dressing or bathing? Y Y  Doing errands, shopping? Tempie Donning  Preparing Food  and eating ? Y Y  Using the Toilet? N Y  In the past six months, have you accidently leaked urine? Y Y  Do you have problems with loss of bowel control? N Y  Managing your Medications? Y Y  Managing your Finances? N N  Housekeeping or managing your Housekeeping? Tempie Donning    Fall/Depression Screening:    PHQ 2/9 Scores 12/28/2015 11/17/2015 11/12/2015  PHQ - 2 Score 1 0 1    Assessment:  Routine home visit, Bunnie Philips present during  visit.  COPD Patient denies any increase in COPD symptoms, denies increased cough, sputum production or shortness of breath, wearing oxygen at 3 liters continuous. Patient reports using her spriva on a daily basis she states she takes her symbicort at least once daily. Patient has not refilled her albuterol inhaler, but agrees for caregiver to call in refill on today.  Lower Leg swelling Patient is weighing at least 3 days a week, and recorded by caregiver, not increases in weight gain greater than 3 pounds in a day or 5 pounds in a week. Patient wearing "diabetic' socks, that fit calf area, increased  swelling noted at top of sock area to just below knee, calf area skin redden but intact.  Patient/caregiver question whether she should be taking lasix, placed call to Dr.Perry office spoke with RN to explain finding, recommended, patient wearing compression hose as she has previously been instructed by MD.  Hypertension Patient has had recent office visit with PCP, concern related to blood pressure, care giver reports blood pressure 148/ in MD office, she has received prescription to obtain a blood pressure monitor. Reinforced education on low sodium diet.  Fall Safety No falls,patient wears life alert necklace at all times.   Discussed with patient the Health coach program for continued education and management of her COPD, patient agreeable.  Plan:  Patient will obtain refill on albuterol inhaler RNCM will schedule home visit within a month Transition to health coach when community care manager goals are met.   [facility] CM Care Plan Problem One        Most Recent Value   Care Plan Problem One  COPD self management care   Role Documenting the Problem One  Care Management Coordinator   Care Plan for Problem One  Active   THN Long Term Goal (31-90 days)  Patient will verbalize increased knowledge of COPD self care management   THN Long Term Goal Start Date  12/01/15   Interventions for Problem  One Long Term Goal  Reinforced education on importance of taking medication as prescribed, having rescue inhaler available, for emergency use .    THN CM Short Term Goal #1 (0-30 days)  Patient will be able to state at least 3 symptoms of COPD yellow zone and action plan in the next 30 days    THN CM Short Term Goal #1 Start Date  12/01/15   Interventions for Short Term Goal #1  Reinforced education on COPD yellow zone symptoms and action to take,    THN CM Short Term Goal #2 (0-30 days)  Patient will report increased knowledge of importance of balanced diet and low salt diet adherence in the next 30 days   THN CM Short Term Goal #2 Start Date  12/01/15   Interventions for Short Term Goal #2  Reinforced adherence to low sodium diet,monitor the salt in frozen meals that she eats on a regular basis., reviewed healthy salt limits for daily use.  THN CM Short Term Goal #3 (0-30 days)  Patient will obtain scales and begin to weight at least 3 days a week  and record in the next 30 days [goal adjusted]   THN CM Short Term Goal #3 Start Date  12/01/15   [facility] CM Short Term Goal #3 Met Date  12/28/15   THN CM Short Term Goal #4 (0-30 days)  Patient will obtain blood pressure cuff and caregiver will to check at least 3 days a week in the next 30 days   THN CM Short Term Goal #4 Start Date  12/28/15   Interventions for Short Term Goal #4  Discussed with patient/caregiver, [employer] benefit of quarterly supplies, Educated on importance monitoring blood pressure, demonstarted    THN CM Short Term Goal #5 (0-30 days)  Patient will obtain and wear supportive knee high  hose daily in the nexr 30 days   THN CM Short Term Goal #5 Start Date  12/28/15   Interventions for Short Term Goal #5  Educated on benefits of supportive knee high hose, remove daily to exam feet and legs, continue to wear with one slip socks due to slippery without them.,      [name], RN, [place] Management [phone]-  Mobile [phone]- [place] Office

## 2015-12-29 ENCOUNTER — Encounter: Payer: Self-pay | Admitting: *Deleted

## 2015-12-29 ENCOUNTER — Other Ambulatory Visit: Payer: Self-pay | Admitting: *Deleted

## 2015-12-29 NOTE — Patient Outreach (Signed)
[place] [facility]) Care Management  12/29/2015  [name] 1926/12/15 [id number]   CSW will perform a case closure on patient, due to inability to maintain phone contact, despite required number of attempts made and outreach letter mailed to patient's home.   CSW will notify patient's RNCM with [place] [facility], [name] of CSW's plans to close patient's case. CSW will fax a correspondence letter to patient's Primary Care Physician, Dr. Reinaldo Meeker to ensure that [name] is aware of CSW's case closure plans.   CSW will submit a case closure request to Lurline Del, Care Management Assistant with [place] Management, in the form of an In [employer].  CSW will ensure that [name] is aware of Samuella Cota, RNCM with [place] [facility], continued involvement with patient's care.  [name], BSW, MSW, LCSW  Licensed Education officer, environmental Health System  Mailing [place] [address], [place], [place] [address] Physical Address-[address] [place], [place], [place] [address] Toll Free Main # [phone] Fax # [phone] Cell # [phone]  Fax # [phone]  [name]  Patient's preferred language:  [place]   English  ATTENTION:  If you speak [place], language assistance services, free of charge, are available to you.    Nondiscrimination and Accessibility Statement: Discrimination is Against the [employer], a subsidiary of [employer], complies with [employer] civil rights laws and does not discriminate on the basis of race, color, national origin, age, disability, or sex.  [place] does not exclude people or treat them differently because of race, color, national origin, age, disability, or sex.  [place] Providers will:  . Provide free aids and  services to people with disabilities to communicate effectively with [place], such as:     ? Qualified sign language interpreters  ? Written information in other formats (large print, audio, accessible electronic formats, other formats)   . Provide free language services to people whose primary language is not [place], such as:    ? Qualified interpreters    ? Information written in other languages   If you need these services, contact your [facility] Fore[facility] psychologist.  If you believe that a [facility] [employer] has failed to provide these services or discriminated in another way on the basis of race, color, national origin, age, disability, or sex, you can file a Tourist information centre manager with: [place], [phone] or [email or web address]  You can file a grievance in person or by mail, fax, or email. If you need help filing a grievance, you may contact Valrie Hart, Interim Compliance Officer, [facility] Department of Compliance and Integrity, [place] 2nd Floor, [place], [place]. [place], [phone], [email or web address].    You can also file a civil rights complaint with the U.S. Department of Health and Financial controller, Office for [employer], electronically through the Office for Civil Rights Complaint Portal, available at OnSiteLending.nl.jsf, or by mail or phone at:  [place]. Department of Health and Human Services [address], [place] Room [phone], [facility] Building [place], [place]  [phone], [phone] (TDD) Complaint forms are available at [email or web address].

## 2015-12-30 ENCOUNTER — Telehealth: Payer: Self-pay | Admitting: Pulmonary Disease

## 2015-12-30 NOTE — Telephone Encounter (Signed)
Spoke with pt. She is needing a refill on Proair. Advised her that since we have not seen since 12/2014 [facility]), we would need to see her to refill this. States that she will call her PCP to refill this. Advised her that if she needs an appointment with [place] to just call. She agreed and verbalized understanding. Nothing further was needed.

## 2016-01-07 ENCOUNTER — Other Ambulatory Visit: Payer: Self-pay | Admitting: Pharmacist

## 2016-01-07 NOTE — Patient Outreach (Signed)
[facility]) Care Management  [place]   01/07/2016  [name] May 21, 1927 [id number]  Subjective:  Phone call placed to patient to follow-up home visit last month.  Patient verified name and date of birth.  Patient reports that she has Symbicort inhaler in her possession now and she plans to contact her PCP for albuterol inhaler as pulmonary wanted her to make an office appointment since she hadn't been seen in over a year.  Patient also had patient assistance concerns at initial home visit but does not wish to apply for manufacturer patient assistance.   She is aware of cost savings for her prescriptions through mail order pharmacy but prefers to use her local pharmacy at this time.    Patient reports she is using Symbicort every day and when asked if she is rinsing her mouth with water after each use she says no.   Reports caregiver helps her with medications.   Objective:   Current Medications: Current Outpatient Prescriptions  Medication Sig Dispense Refill  . acetaminophen (TYLENOL) 500 MG tablet Take 500 mg by mouth every 6 (six) hours as needed. Reported on 12/01/2015    . albuterol (PROVENTIL HFA;VENTOLIN HFA) 108 (90 BASE) MCG/ACT inhaler Inhale 2 puffs into the lungs every 6 (six) hours as needed. For wheezing (Patient not taking: Reported on 12/01/2015) 1 Inhaler 6  . budesonide-formoterol (SYMBICORT) 160-4.5 MCG/ACT inhaler Inhale 2 puffs into the lungs 2 (two) times daily. 1 Inhaler 6  . donepezil (ARICEPT) 5 MG tablet Take 5 mg by mouth at bedtime.    . fish oil-omega-3 fatty acids 1000 MG capsule Take 1 capsule by mouth daily.    . furosemide (LASIX) 20 MG tablet Take 20 mg by mouth daily as needed. Reported on 12/28/2015    . KLOR-CON 10 10 MEQ tablet Take 20 mEq by mouth daily. Reported on 12/28/2015    . metoprolol succinate (TOPROL-XL) 25 MG 24 hr tablet Take 25 mg by mouth daily.     . Multiple Vitamins-Minerals (MULTIVITAMIN WITH MINERALS)  tablet Take 1 tablet by mouth daily.    . simvastatin (ZOCOR) 40 MG tablet Take 40 mg by mouth daily.    Marland Kitchen SPIRIVA HANDIHALER 18 MCG inhalation capsule INHALE CONTENTS OF 1 CAPSULE ONCE DAILY 30 capsule 5  . triamcinolone cream (KENALOG) 0.1 % Apply 1 application topically daily as needed.     No current facility-administered medications for this visit.    Functional Status: In your present state of health, do you have any difficulty performing the following activities: 12/28/2015 11/17/2015  Hearing? Tempie Donning  Vision? N Y  Difficulty concentrating or making decisions? Tempie Donning  Walking or climbing stairs? Y Y  Dressing or bathing? Y Y  Doing errands, shopping? Tempie Donning  Preparing Food and eating ? Y Y  Using the Toilet? N Y  In the past six months, have you accidently leaked urine? Y Y  Do you have problems with loss of bowel control? N Y  Managing your Medications? Y Y  Managing your Finances? N N  Housekeeping or managing your Housekeeping? Tempie Donning    Fall/Depression Screening: PHQ 2/9 Scores 12/28/2015 11/17/2015 11/12/2015  PHQ - 2 Score 1 0 1    Assessment:  Patient counseling:  Counseled patient to rinse mouth with water after each use of Symbicort to reduce risk of thrush.    Patient denies any other pharmacy needs, questions, or concerns about her medications.    Plan:  1) Will close out pharmacy case at this time as patient denies pharmacy concerns or questions.    Patient was provided with pharmacist phone number should she have any questions/concerns related to her medications in the future.   Will alert Maudie Mercury [place] of pharmacy case closure.   Karrie Meres, PharmD, [place] [phone]

## 2016-01-08 DIAGNOSIS — J449 Chronic obstructive pulmonary disease, unspecified: Secondary | ICD-10-CM | POA: Diagnosis not present

## 2016-01-12 ENCOUNTER — Other Ambulatory Visit: Payer: Self-pay | Admitting: Adult Health

## 2016-01-12 MED ORDER — ALBUTEROL SULFATE HFA 108 (90 BASE) MCG/ACT IN AERS: 2.0000 | INHALATION_SPRAY | Freq: Four times a day (QID) | RESPIRATORY_TRACT | Status: AC | PRN

## 2016-01-12 NOTE — Telephone Encounter (Signed)
30-day supply sent to pharmacy under TP Patient needs to schedule OV with new Pulm MD to establish care.

## 2016-01-18 DIAGNOSIS — J449 Chronic obstructive pulmonary disease, unspecified: Secondary | ICD-10-CM | POA: Diagnosis not present

## 2016-01-22 ENCOUNTER — Other Ambulatory Visit: Payer: Self-pay | Admitting: *Deleted

## 2016-01-22 NOTE — Patient Outreach (Signed)
[place] [facility]) Care Management  01/22/2016  [name] 05-Apr-1927 [id number]   Incoming call from [name] requesting to cancel scheduled visit for 5/10, and requested that I call her back on next week to reschedule.   Plan RNCM will return call to patient next week  to reschedule home visit.

## 2016-01-27 ENCOUNTER — Ambulatory Visit: Payer: Self-pay | Admitting: *Deleted

## 2016-02-03 ENCOUNTER — Encounter: Payer: Self-pay | Admitting: *Deleted

## 2016-02-03 ENCOUNTER — Other Ambulatory Visit: Payer: Self-pay | Admitting: *Deleted

## 2016-02-03 NOTE — Patient Outreach (Addendum)
[place] [facility] Care Management  02/03/2016  [name] 12-19-1926 [id number]   Follow up telephone call to [name] to reschedule home visit. [name] states she is managing well at home and does not need a home visit.  Patient continues to have her personal care assistant [name] with her 3 days a week, that helps her with managing her medications. Patient reports that she has all of her medications.  Discussed the health coach program with telephone follow up for education and management of her chronic disease COPD, [name] reports she is doing well, she expressed thanks  for all that I have done and feel her needs have been met and declines any further needs for care management and she does not need any further calls or visits.  Provided patient again with [facility] contact information for further needs.  Plan Will close case per protocol.  Amber Draft, RN, [place] Management [phone]- Mobile [phone]- Toll Free Main Office

## 2016-02-07 DIAGNOSIS — J449 Chronic obstructive pulmonary disease, unspecified: Secondary | ICD-10-CM | POA: Diagnosis not present

## 2016-02-18 DIAGNOSIS — J449 Chronic obstructive pulmonary disease, unspecified: Secondary | ICD-10-CM | POA: Diagnosis not present

## 2016-03-01 DIAGNOSIS — Z6836 Body mass index (BMI) 36.0-36.9, adult: Secondary | ICD-10-CM | POA: Diagnosis not present

## 2016-03-01 DIAGNOSIS — Z0000 Encounter for general adult medical examination without abnormal findings: Secondary | ICD-10-CM | POA: Diagnosis not present

## 2016-03-04 ENCOUNTER — Encounter: Payer: Self-pay | Admitting: Internal Medicine

## 2016-03-04 ENCOUNTER — Ambulatory Visit (INDEPENDENT_AMBULATORY_CARE_PROVIDER_SITE_OTHER): Payer: Medicare Other | Admitting: Internal Medicine

## 2016-03-04 VITALS — BP 142/92 | HR 77 | Ht 63.5 in | Wt 169.0 lb

## 2016-03-04 DIAGNOSIS — J9611 Chronic respiratory failure with hypoxia: Secondary | ICD-10-CM | POA: Diagnosis not present

## 2016-03-04 NOTE — Progress Notes (Signed)
* [facility]     Assessment and Plan:  Patient is a 82 year old female with chronic respiratory failure, dependent on 3-4 L of oxygen, with severe emphysema.  COPD/Emphysema.  -Continues Spiriva daily, asked to try to remember to use Symbicort 2 puffs at least once daily, though ideally she should be using it twice a day. -Appears to be doing relatively well despite her severe emphysema, she's not had any recent exacerbations.  Chronic respiratory failure.  -Continued chronic severe respiratory failure, dependent on 3 L of oxygen. Patient is asked to continue using 3 L of oxygen at rest and 4 L with activity  Debility/deconditioning. -The patient has significant weakness in her arms and legs, which limits her activity, she has declined to attend any further sessions of pulmonary rehabilitation.  Kyphosis. -Likely contributing to reduced lung volumes, as above. She has declined any further pulmonary rehabilitation.  Date: 03/04/2016  MRN# [id number] [name] [birth year]   [name] is a 82 y.o. old female seen in follow up for chief complaint of  Chief Complaint  Patient presents with  . pulmonary consult    former [name] pt last seen 12/2014 for emphysema & COPD. pt states breathing is doing wll.  c/o sob w/exertion. denies wheezing, cough or cp/tightness     HPI:   The patient is an 82 year old female with COPD, chronic respiratory failure on oxygen and Symbicort. She had been referred by Dr. Gwenette Cain to pulmonary rehabilitation in the past, she had gone for 2 weeks but then decided not to go again. She stopped because she did not feel like going anymore.   She wears oxygen at 3L at all times, but up to 4 with activity such as bathing. She does not drive a car, she lives at home by herself. Her caregiver [name] is here today, she is with her 3 days per week and is on call for her. The patient rarely leaves the house other than for appointments.  She can  walk her house, she uses a walker. She requires assistance for bathing.  She is limited by weakness in her arms and legs.   She is using spiriva once daily, symbicort 2 puffs 2 or 3 days per week. [name] gives much of the history. She has an albuterol inhaler-- rarely used. She has stayed out of the hospital for more than a year. She reports no recent exacerbations in the past year.   Medication:   Outpatient Encounter Prescriptions as of 03/04/2016  Medication Sig  . acetaminophen (TYLENOL) 500 MG tablet Take 500 mg by mouth every 6 (six) hours as needed. Reported on 12/01/2015  . albuterol (PROVENTIL HFA;VENTOLIN HFA) 108 (90 Base) MCG/ACT inhaler Inhale 2 puffs into the lungs every 6 (six) hours as needed. For wheezing  . budesonide-formoterol (SYMBICORT) 160-4.5 MCG/ACT inhaler Inhale 2 puffs into the lungs 2 (two) times daily.  Marland Kitchen donepezil (ARICEPT) 5 MG tablet Take 5 mg by mouth at bedtime.  . fish oil-omega-3 fatty acids 1000 MG capsule Take 1 capsule by mouth daily.  . furosemide (LASIX) 20 MG tablet Take 20 mg by mouth daily as needed. Reported on 12/28/2015  . KLOR-CON 10 10 MEQ tablet Take 20 mEq by mouth daily. Reported on 12/28/2015  . metoprolol succinate (TOPROL-XL) 25 MG 24 hr tablet Take 25 mg by mouth daily.   . Multiple Vitamins-Minerals (MULTIVITAMIN WITH MINERALS) tablet Take 1 tablet by mouth daily.  . simvastatin (ZOCOR) 40 MG tablet Take 40 mg  by mouth daily.  Marland Kitchen SPIRIVA HANDIHALER 18 MCG inhalation capsule INHALE CONTENTS OF 1 CAPSULE ONCE DAILY  . triamcinolone cream (KENALOG) 0.1 % Apply 1 application topically daily as needed.   No facility-administered encounter medications on file as of 03/04/2016.     Allergies:  Codeine; Iohexol; Risedronate sodium; and Sulfonamide derivatives  Review of Systems: Gen:  Denies  fever, sweats. HEENT: Denies blurred vision. Cvc:  No dizziness, chest pain or heaviness Resp:   Denies cough or sputum porduction. Gi: Denies  swallowing difficulty, stomach pain. Constipation. Gu:  Denies bladder incontinence, burning urine Ext:   No Joint pain, stiffness. Skin: No skin rash, easy bruising. Endoc:  No polyuria, polydipsia. Psych: No depression, insomnia. Other:  All other systems were reviewed and found to be negative other than what is mentioned in the HPI.   Physical Examination:   VS: BP 142/92 mmHg  Pulse 77  Ht 5' 3.5" (1.613 m)  Wt 169 lb (76.658 kg)  BMI 29.46 kg/m2  SpO2 92%  General Appearance: No distress  Neuro:without focal findings,  speech normal,  HEENT: PERRLA, EOM intact. Pulmonary: normal breath sounds, No wheezing. Significant kyphosis.  CardiovascularNormal S1,S2.  No m/r/g.   Abdomen: Benign, Soft, non-tender. Renal:  No costovertebral tenderness  GU:  Not performed at this time. Endoc: No evident thyromegaly, no signs of acromegaly. Skin:   warm, no rash. Extremities: normal, no cyanosis, clubbing.   LABORATORY PANEL:   CBC No results for input(s): WBC, HGB, HCT, PLT in the last 168 hours. ------------------------------------------------------------------------------------------------------------------  Chemistries  No results for input(s): NA, K, CL, CO2, GLUCOSE, BUN, CREATININE, CALCIUM, MG, AST, ALT, ALKPHOS, BILITOT in the last 168 hours.  Invalid input(s): GFRCGP ------------------------------------------------------------------------------------------------------------------  Cardiac Enzymes No results for input(s): TROPONINI in the last 168 hours. ------------------------------------------------------------  RADIOLOGY:   No results found for this or any previous visit. Results for orders placed during the hospital encounter of 10/16/13  DG Chest 2 View   Narrative CLINICAL DATA:  History of COPD  EXAM: CHEST  2 VIEW  COMPARISON:  DG CHEST 2 VIEW dated 11/07/2011  FINDINGS: Low lung volumes. Linear areas of increased density project within the right  left lung bases. No focal regions of consolidation appreciated. The bibasilar findings are stable. The aorta is tortuous and ectatic. Atherosclerotic calcifications identified within the aorta. The bones are osteopenic. Chronic compression foreign is identified within the mid and lower thoracic spine. . There is increased AP diameter of the chest.  IMPRESSION: 1. COPD 2. Chronic bibasilar fibrotic changes 3. No evidence of acute cardiopulmonary disease.   Electronically Signed   By: Margaree Mackintosh M.D.   On: 10/16/2013 13:26    ------------------------------------------------------------------------------------------------------------------  Thank  you for allowing [facility] to assist in the care of your patient. Our recommendations are noted above.  Please contact us if we can be of further service.   Marda Stalker, MD.  [facility] Office Number: [phone]  Patricia Pesa, M.D.  Vilinda Boehringer, M.D.  Merton Border, M.D  03/04/2016

## 2016-03-04 NOTE — Patient Instructions (Signed)
Use symbicort 2 puffs twice daily, rinse mouth after using.

## 2016-03-09 DIAGNOSIS — J449 Chronic obstructive pulmonary disease, unspecified: Secondary | ICD-10-CM | POA: Diagnosis not present

## 2016-03-19 DIAGNOSIS — J449 Chronic obstructive pulmonary disease, unspecified: Secondary | ICD-10-CM | POA: Diagnosis not present

## 2016-03-23 ENCOUNTER — Institutional Professional Consult (permissible substitution): Payer: Medicare Other | Admitting: Internal Medicine

## 2016-04-08 DIAGNOSIS — J449 Chronic obstructive pulmonary disease, unspecified: Secondary | ICD-10-CM | POA: Diagnosis not present

## 2016-04-11 ENCOUNTER — Encounter: Payer: Self-pay | Admitting: *Deleted

## 2016-04-13 DIAGNOSIS — I2510 Atherosclerotic heart disease of native coronary artery without angina pectoris: Secondary | ICD-10-CM | POA: Diagnosis not present

## 2016-04-13 DIAGNOSIS — I10 Essential (primary) hypertension: Secondary | ICD-10-CM | POA: Diagnosis not present

## 2016-04-13 DIAGNOSIS — J449 Chronic obstructive pulmonary disease, unspecified: Secondary | ICD-10-CM | POA: Diagnosis not present

## 2016-04-13 DIAGNOSIS — E785 Hyperlipidemia, unspecified: Secondary | ICD-10-CM | POA: Diagnosis not present

## 2016-04-13 DIAGNOSIS — G309 Alzheimer's disease, unspecified: Secondary | ICD-10-CM | POA: Diagnosis not present

## 2016-04-13 DIAGNOSIS — M810 Age-related osteoporosis without current pathological fracture: Secondary | ICD-10-CM | POA: Diagnosis not present

## 2016-04-19 DIAGNOSIS — J449 Chronic obstructive pulmonary disease, unspecified: Secondary | ICD-10-CM | POA: Diagnosis not present

## 2016-05-09 DIAGNOSIS — J449 Chronic obstructive pulmonary disease, unspecified: Secondary | ICD-10-CM | POA: Diagnosis not present

## 2016-05-20 DIAGNOSIS — J449 Chronic obstructive pulmonary disease, unspecified: Secondary | ICD-10-CM | POA: Diagnosis not present

## 2016-06-09 DIAGNOSIS — J449 Chronic obstructive pulmonary disease, unspecified: Secondary | ICD-10-CM | POA: Diagnosis not present

## 2016-06-19 DIAGNOSIS — J449 Chronic obstructive pulmonary disease, unspecified: Secondary | ICD-10-CM | POA: Diagnosis not present

## 2016-07-09 DIAGNOSIS — J449 Chronic obstructive pulmonary disease, unspecified: Secondary | ICD-10-CM | POA: Diagnosis not present

## 2016-07-20 DIAGNOSIS — J449 Chronic obstructive pulmonary disease, unspecified: Secondary | ICD-10-CM | POA: Diagnosis not present

## 2016-08-09 DIAGNOSIS — J449 Chronic obstructive pulmonary disease, unspecified: Secondary | ICD-10-CM | POA: Diagnosis not present

## 2017-01-17 DIAGNOSIS — J449 Chronic obstructive pulmonary disease, unspecified: Secondary | ICD-10-CM | POA: Diagnosis not present

## 2017-02-06 DIAGNOSIS — J449 Chronic obstructive pulmonary disease, unspecified: Secondary | ICD-10-CM | POA: Diagnosis not present

## 2017-02-08 DIAGNOSIS — E7800 Pure hypercholesterolemia, unspecified: Secondary | ICD-10-CM | POA: Diagnosis not present

## 2017-02-08 DIAGNOSIS — L259 Unspecified contact dermatitis, unspecified cause: Secondary | ICD-10-CM | POA: Diagnosis not present

## 2017-02-08 DIAGNOSIS — E039 Hypothyroidism, unspecified: Secondary | ICD-10-CM | POA: Diagnosis not present

## 2017-02-08 DIAGNOSIS — N390 Urinary tract infection, site not specified: Secondary | ICD-10-CM | POA: Diagnosis not present

## 2017-02-08 DIAGNOSIS — E119 Type 2 diabetes mellitus without complications: Secondary | ICD-10-CM | POA: Diagnosis not present

## 2017-02-08 DIAGNOSIS — J449 Chronic obstructive pulmonary disease, unspecified: Secondary | ICD-10-CM | POA: Diagnosis not present

## 2017-02-17 DIAGNOSIS — J449 Chronic obstructive pulmonary disease, unspecified: Secondary | ICD-10-CM | POA: Diagnosis not present

## 2017-03-03 DIAGNOSIS — R609 Edema, unspecified: Secondary | ICD-10-CM | POA: Diagnosis not present

## 2017-03-03 DIAGNOSIS — R5383 Other fatigue: Secondary | ICD-10-CM | POA: Diagnosis not present

## 2017-03-03 DIAGNOSIS — R300 Dysuria: Secondary | ICD-10-CM | POA: Diagnosis not present

## 2017-03-03 DIAGNOSIS — N390 Urinary tract infection, site not specified: Secondary | ICD-10-CM | POA: Diagnosis not present

## 2017-03-09 DIAGNOSIS — J449 Chronic obstructive pulmonary disease, unspecified: Secondary | ICD-10-CM | POA: Diagnosis not present

## 2017-03-19 DIAGNOSIS — J449 Chronic obstructive pulmonary disease, unspecified: Secondary | ICD-10-CM | POA: Diagnosis not present

## 2017-03-30 DIAGNOSIS — N390 Urinary tract infection, site not specified: Secondary | ICD-10-CM | POA: Diagnosis not present

## 2017-03-30 DIAGNOSIS — R300 Dysuria: Secondary | ICD-10-CM | POA: Diagnosis not present

## 2017-04-08 DIAGNOSIS — J449 Chronic obstructive pulmonary disease, unspecified: Secondary | ICD-10-CM | POA: Diagnosis not present

## 2017-04-19 DIAGNOSIS — J449 Chronic obstructive pulmonary disease, unspecified: Secondary | ICD-10-CM | POA: Diagnosis not present

## 2017-05-09 DIAGNOSIS — J449 Chronic obstructive pulmonary disease, unspecified: Secondary | ICD-10-CM | POA: Diagnosis not present

## 2017-05-20 DIAGNOSIS — J449 Chronic obstructive pulmonary disease, unspecified: Secondary | ICD-10-CM | POA: Diagnosis not present

## 2017-06-09 DIAGNOSIS — J449 Chronic obstructive pulmonary disease, unspecified: Secondary | ICD-10-CM | POA: Diagnosis not present

## 2017-07-20 DIAGNOSIS — J449 Chronic obstructive pulmonary disease, unspecified: Secondary | ICD-10-CM | POA: Diagnosis not present

## 2017-08-09 DIAGNOSIS — J449 Chronic obstructive pulmonary disease, unspecified: Secondary | ICD-10-CM | POA: Diagnosis not present

## 2017-08-19 DIAGNOSIS — J449 Chronic obstructive pulmonary disease, unspecified: Secondary | ICD-10-CM | POA: Diagnosis not present

## 2017-08-31 DIAGNOSIS — I10 Essential (primary) hypertension: Secondary | ICD-10-CM | POA: Diagnosis not present

## 2017-08-31 DIAGNOSIS — G309 Alzheimer's disease, unspecified: Secondary | ICD-10-CM | POA: Diagnosis not present

## 2017-08-31 DIAGNOSIS — G300 Alzheimer's disease with early onset: Secondary | ICD-10-CM | POA: Diagnosis not present

## 2017-08-31 DIAGNOSIS — I2510 Atherosclerotic heart disease of native coronary artery without angina pectoris: Secondary | ICD-10-CM | POA: Diagnosis not present

## 2017-08-31 DIAGNOSIS — Z23 Encounter for immunization: Secondary | ICD-10-CM | POA: Diagnosis not present

## 2017-08-31 DIAGNOSIS — E782 Mixed hyperlipidemia: Secondary | ICD-10-CM | POA: Diagnosis not present

## 2017-08-31 DIAGNOSIS — J449 Chronic obstructive pulmonary disease, unspecified: Secondary | ICD-10-CM | POA: Diagnosis not present

## 2017-09-08 DIAGNOSIS — J449 Chronic obstructive pulmonary disease, unspecified: Secondary | ICD-10-CM | POA: Diagnosis not present

## 2017-09-18 DIAGNOSIS — G309 Alzheimer's disease, unspecified: Secondary | ICD-10-CM | POA: Diagnosis not present

## 2017-09-18 DIAGNOSIS — R262 Difficulty in walking, not elsewhere classified: Secondary | ICD-10-CM | POA: Diagnosis not present

## 2017-09-18 DIAGNOSIS — J449 Chronic obstructive pulmonary disease, unspecified: Secondary | ICD-10-CM | POA: Diagnosis not present

## 2017-09-18 DIAGNOSIS — E785 Hyperlipidemia, unspecified: Secondary | ICD-10-CM | POA: Diagnosis not present

## 2017-09-18 DIAGNOSIS — M810 Age-related osteoporosis without current pathological fracture: Secondary | ICD-10-CM | POA: Diagnosis not present

## 2017-09-18 DIAGNOSIS — I2510 Atherosclerotic heart disease of native coronary artery without angina pectoris: Secondary | ICD-10-CM | POA: Diagnosis not present

## 2017-09-18 DIAGNOSIS — I10 Essential (primary) hypertension: Secondary | ICD-10-CM | POA: Diagnosis not present

## 2017-09-18 DIAGNOSIS — M6281 Muscle weakness (generalized): Secondary | ICD-10-CM | POA: Diagnosis not present

## 2017-09-29 DIAGNOSIS — K5900 Constipation, unspecified: Secondary | ICD-10-CM | POA: Diagnosis not present

## 2017-09-29 DIAGNOSIS — J690 Pneumonitis due to inhalation of food and vomit: Secondary | ICD-10-CM | POA: Diagnosis not present

## 2017-09-29 DIAGNOSIS — Z66 Do not resuscitate: Secondary | ICD-10-CM | POA: Diagnosis not present

## 2017-09-29 DIAGNOSIS — I5033 Acute on chronic diastolic (congestive) heart failure: Secondary | ICD-10-CM | POA: Diagnosis not present

## 2017-09-29 DIAGNOSIS — J392 Other diseases of pharynx: Secondary | ICD-10-CM | POA: Diagnosis not present

## 2017-09-29 DIAGNOSIS — Z79899 Other long term (current) drug therapy: Secondary | ICD-10-CM | POA: Diagnosis not present

## 2017-09-29 DIAGNOSIS — Z515 Encounter for palliative care: Secondary | ICD-10-CM | POA: Diagnosis not present

## 2017-09-29 DIAGNOSIS — J90 Pleural effusion, not elsewhere classified: Secondary | ICD-10-CM | POA: Diagnosis not present

## 2017-09-29 DIAGNOSIS — J189 Pneumonia, unspecified organism: Secondary | ICD-10-CM | POA: Diagnosis not present

## 2017-09-29 DIAGNOSIS — J439 Emphysema, unspecified: Secondary | ICD-10-CM | POA: Diagnosis not present

## 2017-09-29 DIAGNOSIS — J387 Other diseases of larynx: Secondary | ICD-10-CM | POA: Diagnosis not present

## 2017-09-29 DIAGNOSIS — J440 Chronic obstructive pulmonary disease with acute lower respiratory infection: Secondary | ICD-10-CM | POA: Diagnosis not present

## 2017-09-29 DIAGNOSIS — J9621 Acute and chronic respiratory failure with hypoxia: Secondary | ICD-10-CM | POA: Diagnosis not present

## 2017-09-29 DIAGNOSIS — I110 Hypertensive heart disease with heart failure: Secondary | ICD-10-CM | POA: Diagnosis not present

## 2017-09-29 DIAGNOSIS — E785 Hyperlipidemia, unspecified: Secondary | ICD-10-CM | POA: Diagnosis not present

## 2017-09-29 DIAGNOSIS — R918 Other nonspecific abnormal finding of lung field: Secondary | ICD-10-CM | POA: Diagnosis not present

## 2017-09-29 DIAGNOSIS — Z9911 Dependence on respirator [ventilator] status: Secondary | ICD-10-CM | POA: Diagnosis not present

## 2017-09-29 DIAGNOSIS — Z23 Encounter for immunization: Secondary | ICD-10-CM | POA: Diagnosis not present

## 2017-09-29 DIAGNOSIS — R0603 Acute respiratory distress: Secondary | ICD-10-CM | POA: Diagnosis not present

## 2017-09-29 DIAGNOSIS — F0390 Unspecified dementia without behavioral disturbance: Secondary | ICD-10-CM | POA: Diagnosis not present

## 2017-09-29 DIAGNOSIS — R069 Unspecified abnormalities of breathing: Secondary | ICD-10-CM | POA: Diagnosis not present

## 2017-09-29 DIAGNOSIS — J9601 Acute respiratory failure with hypoxia: Secondary | ICD-10-CM | POA: Diagnosis not present

## 2017-09-29 DIAGNOSIS — Z538 Procedure and treatment not carried out for other reasons: Secondary | ICD-10-CM | POA: Diagnosis not present

## 2017-09-29 DIAGNOSIS — J019 Acute sinusitis, unspecified: Secondary | ICD-10-CM | POA: Diagnosis not present

## 2017-09-29 DIAGNOSIS — J9602 Acute respiratory failure with hypercapnia: Secondary | ICD-10-CM | POA: Diagnosis not present

## 2017-09-29 DIAGNOSIS — R1310 Dysphagia, unspecified: Secondary | ICD-10-CM | POA: Diagnosis not present

## 2017-09-29 DIAGNOSIS — J9622 Acute and chronic respiratory failure with hypercapnia: Secondary | ICD-10-CM | POA: Diagnosis not present

## 2017-09-29 DIAGNOSIS — A419 Sepsis, unspecified organism: Secondary | ICD-10-CM | POA: Diagnosis not present

## 2017-09-29 DIAGNOSIS — Z452 Encounter for adjustment and management of vascular access device: Secondary | ICD-10-CM | POA: Diagnosis not present

## 2017-09-29 DIAGNOSIS — J449 Chronic obstructive pulmonary disease, unspecified: Secondary | ICD-10-CM | POA: Diagnosis not present

## 2017-09-29 DIAGNOSIS — E872 Acidosis: Secondary | ICD-10-CM | POA: Diagnosis not present

## 2017-09-29 DIAGNOSIS — Z4682 Encounter for fitting and adjustment of non-vascular catheter: Secondary | ICD-10-CM | POA: Diagnosis not present

## 2017-09-29 DIAGNOSIS — R0902 Hypoxemia: Secondary | ICD-10-CM | POA: Diagnosis not present

## 2017-09-29 DIAGNOSIS — J9690 Respiratory failure, unspecified, unspecified whether with hypoxia or hypercapnia: Secondary | ICD-10-CM | POA: Diagnosis not present

## 2017-09-29 DIAGNOSIS — J9811 Atelectasis: Secondary | ICD-10-CM | POA: Diagnosis not present

## 2017-09-29 DIAGNOSIS — R296 Repeated falls: Secondary | ICD-10-CM | POA: Diagnosis not present

## 2017-09-29 DIAGNOSIS — R0602 Shortness of breath: Secondary | ICD-10-CM | POA: Diagnosis not present

## 2017-09-29 DIAGNOSIS — Z87440 Personal history of urinary (tract) infections: Secondary | ICD-10-CM | POA: Diagnosis not present

## 2017-09-29 DIAGNOSIS — T884XXA Failed or difficult intubation, initial encounter: Secondary | ICD-10-CM | POA: Diagnosis not present

## 2017-09-30 DIAGNOSIS — R0602 Shortness of breath: Secondary | ICD-10-CM | POA: Diagnosis not present

## 2017-10-03 DIAGNOSIS — I110 Hypertensive heart disease with heart failure: Secondary | ICD-10-CM | POA: Diagnosis not present

## 2017-10-03 DIAGNOSIS — J440 Chronic obstructive pulmonary disease with acute lower respiratory infection: Secondary | ICD-10-CM | POA: Diagnosis not present

## 2017-10-03 DIAGNOSIS — I5033 Acute on chronic diastolic (congestive) heart failure: Secondary | ICD-10-CM | POA: Diagnosis not present

## 2017-10-03 DIAGNOSIS — F0390 Unspecified dementia without behavioral disturbance: Secondary | ICD-10-CM | POA: Diagnosis not present

## 2017-10-03 DIAGNOSIS — E785 Hyperlipidemia, unspecified: Secondary | ICD-10-CM | POA: Diagnosis not present

## 2017-10-03 DIAGNOSIS — J9621 Acute and chronic respiratory failure with hypoxia: Secondary | ICD-10-CM | POA: Diagnosis not present

## 2017-10-03 DIAGNOSIS — J9622 Acute and chronic respiratory failure with hypercapnia: Secondary | ICD-10-CM | POA: Diagnosis not present

## 2017-10-07 DIAGNOSIS — R0602 Shortness of breath: Secondary | ICD-10-CM | POA: Diagnosis not present

## 2017-10-11 DIAGNOSIS — I110 Hypertensive heart disease with heart failure: Secondary | ICD-10-CM | POA: Diagnosis not present

## 2017-10-11 DIAGNOSIS — E785 Hyperlipidemia, unspecified: Secondary | ICD-10-CM | POA: Diagnosis not present

## 2017-10-11 DIAGNOSIS — J9621 Acute and chronic respiratory failure with hypoxia: Secondary | ICD-10-CM | POA: Diagnosis not present

## 2017-10-11 DIAGNOSIS — F0390 Unspecified dementia without behavioral disturbance: Secondary | ICD-10-CM | POA: Diagnosis not present

## 2017-10-11 DIAGNOSIS — J440 Chronic obstructive pulmonary disease with acute lower respiratory infection: Secondary | ICD-10-CM | POA: Diagnosis not present

## 2017-10-11 DIAGNOSIS — I5033 Acute on chronic diastolic (congestive) heart failure: Secondary | ICD-10-CM | POA: Diagnosis not present

## 2017-10-11 DIAGNOSIS — J9622 Acute and chronic respiratory failure with hypercapnia: Secondary | ICD-10-CM | POA: Diagnosis not present

## 2017-10-16 DIAGNOSIS — E785 Hyperlipidemia, unspecified: Secondary | ICD-10-CM | POA: Diagnosis not present

## 2017-10-16 DIAGNOSIS — Z23 Encounter for immunization: Secondary | ICD-10-CM | POA: Diagnosis not present

## 2017-10-16 DIAGNOSIS — Z538 Procedure and treatment not carried out for other reasons: Secondary | ICD-10-CM | POA: Diagnosis not present

## 2017-10-16 DIAGNOSIS — J9621 Acute and chronic respiratory failure with hypoxia: Secondary | ICD-10-CM | POA: Diagnosis not present

## 2017-10-16 DIAGNOSIS — R1310 Dysphagia, unspecified: Secondary | ICD-10-CM | POA: Diagnosis not present

## 2017-10-16 DIAGNOSIS — Z87440 Personal history of urinary (tract) infections: Secondary | ICD-10-CM | POA: Diagnosis not present

## 2017-10-16 DIAGNOSIS — K5900 Constipation, unspecified: Secondary | ICD-10-CM | POA: Diagnosis not present

## 2017-10-16 DIAGNOSIS — Z515 Encounter for palliative care: Secondary | ICD-10-CM | POA: Diagnosis not present

## 2017-10-16 DIAGNOSIS — Z79899 Other long term (current) drug therapy: Secondary | ICD-10-CM | POA: Diagnosis not present

## 2017-10-16 DIAGNOSIS — E872 Acidosis: Secondary | ICD-10-CM | POA: Diagnosis not present

## 2017-10-16 DIAGNOSIS — I110 Hypertensive heart disease with heart failure: Secondary | ICD-10-CM | POA: Diagnosis not present

## 2017-10-16 DIAGNOSIS — J9622 Acute and chronic respiratory failure with hypercapnia: Secondary | ICD-10-CM | POA: Diagnosis not present

## 2017-10-16 DIAGNOSIS — J019 Acute sinusitis, unspecified: Secondary | ICD-10-CM | POA: Diagnosis not present

## 2017-10-16 DIAGNOSIS — J440 Chronic obstructive pulmonary disease with acute lower respiratory infection: Secondary | ICD-10-CM | POA: Diagnosis not present

## 2017-10-16 DIAGNOSIS — J690 Pneumonitis due to inhalation of food and vomit: Secondary | ICD-10-CM | POA: Diagnosis not present

## 2017-10-16 DIAGNOSIS — R296 Repeated falls: Secondary | ICD-10-CM | POA: Diagnosis not present

## 2017-10-16 DIAGNOSIS — I5033 Acute on chronic diastolic (congestive) heart failure: Secondary | ICD-10-CM | POA: Diagnosis not present

## 2017-10-16 DIAGNOSIS — J387 Other diseases of larynx: Secondary | ICD-10-CM | POA: Diagnosis not present

## 2017-10-16 DIAGNOSIS — Z66 Do not resuscitate: Secondary | ICD-10-CM | POA: Diagnosis not present

## 2017-10-20 DEATH — deceased
